# Patient Record
Sex: Female | Born: 1970 | Race: Black or African American | Hispanic: No | Marital: Single | Smoking: Current some day smoker
Health system: Southern US, Community
[De-identification: ages and names within clinical notes are randomized; demographics above are authoritative.]

## PROBLEM LIST (undated history)

## (undated) DIAGNOSIS — F32A Depression, unspecified: Secondary | ICD-10-CM

## (undated) DIAGNOSIS — L608 Other nail disorders: Secondary | ICD-10-CM

## (undated) DIAGNOSIS — L0591 Pilonidal cyst without abscess: Secondary | ICD-10-CM

## (undated) DIAGNOSIS — E785 Hyperlipidemia, unspecified: Secondary | ICD-10-CM

## (undated) DIAGNOSIS — F419 Anxiety disorder, unspecified: Secondary | ICD-10-CM

## (undated) HISTORY — DX: Pilonidal cyst without abscess: L05.91

## (undated) HISTORY — DX: Depression, unspecified: F32.A

## (undated) HISTORY — DX: Other nail disorders: L60.8

## (undated) HISTORY — PX: ABDOMINAL HYSTERECTOMY: SHX81

## (undated) HISTORY — DX: Anxiety disorder, unspecified: F41.9

## (undated) HISTORY — DX: Hyperlipidemia, unspecified: E78.5

---

## 2008-03-01 HISTORY — PX: PILONIDAL CYST EXCISION: SHX744

## 2011-12-24 ENCOUNTER — Emergency Department: Payer: Self-pay | Admitting: Internal Medicine

## 2011-12-27 LAB — WOUND AEROBIC CULTURE

## 2012-08-14 ENCOUNTER — Emergency Department: Payer: Self-pay | Admitting: Emergency Medicine

## 2013-03-01 HISTORY — PX: BREAST BIOPSY: SHX20

## 2013-10-05 ENCOUNTER — Ambulatory Visit: Payer: Self-pay | Admitting: Nurse Practitioner

## 2013-10-12 ENCOUNTER — Ambulatory Visit: Payer: Self-pay | Admitting: Nurse Practitioner

## 2013-10-16 LAB — PATHOLOGY REPORT

## 2013-11-08 ENCOUNTER — Encounter: Payer: Self-pay | Admitting: Podiatry

## 2013-11-08 ENCOUNTER — Ambulatory Visit (INDEPENDENT_AMBULATORY_CARE_PROVIDER_SITE_OTHER): Payer: Medicaid Other | Admitting: Podiatry

## 2013-11-08 VITALS — BP 106/69 | HR 66 | Resp 16 | Ht 67.0 in | Wt 152.0 lb

## 2013-11-08 DIAGNOSIS — L608 Other nail disorders: Secondary | ICD-10-CM

## 2013-11-08 DIAGNOSIS — L600 Ingrowing nail: Secondary | ICD-10-CM

## 2013-11-08 DIAGNOSIS — M79676 Pain in unspecified toe(s): Secondary | ICD-10-CM

## 2013-11-08 NOTE — Patient Instructions (Signed)

## 2013-11-08 NOTE — Progress Notes (Signed)
   Subjective:    Patient ID: Karen George, female    DOB: June 26, 1970, 43 y.o.   MRN: [id number]  HPI Comments: "I have thick ugly toenails and dry skin. My feet have been like this since 2002. They are getting worse. Nails hurt when they grow out"  Karen George, 43 year old female, presents the office today with complaints of thick, ugly toenails. She states that she's had discoloration and thickening to her nails since approximately 2002. She states that since then they have progressively been getting worse. She has had no treatment for this. Also states she gets dry skin at times. No other complaints today.     Review of Systems  Constitutional: Positive for appetite change and unexpected weight change.  Musculoskeletal:       Back pain  All other systems reviewed and are negative.      Objective:   Physical Exam AAO x3, NAD DP/PT pulses palpable 2/4 b/l. CRT < 3sec Protective sensation intact with Derrel Nip monofilament, vibratory sensation intact, Achilles tendon reflex intact. Nails hypertrophic, dystrophic, elongated, discolored. No surrounding erythema or drainage. Nails are firmly adhered to the underlying nailbed. Dry, scaly areas on the plantar aspect of the foot. There does not appear to be any current erythematous changes to the skin around these areas at this time.  No open lesions or pre-ulcerative lesions. MMT 5/5, ROM WNL No leg pain/warmth/edema       Assessment & Plan:  44 year old female with nail dystrophy possible onychomycosis -Various treatment options discussed with the patient including alternatives, risks, complications. Also discussed the etiology. -Nails were debrided and sent for biopsy to evaluate for onychomycosis. Biopsy sent to [facility] labs.  -Medications for onychomycosis were discussed with the patient including over-the-counter versus prescription therapy. However we will await the biopsy results before proceeding with treatment. He should  considering Lamisil.  -Followup after the results of the biopsy are obtained. In the meantime call with any questions or concerns or change in symptoms. -Followup with PCP for issues mentioned in the review of systems as is her chronic issues.

## 2013-11-20 ENCOUNTER — Encounter: Payer: Self-pay | Admitting: Podiatry

## 2013-11-22 ENCOUNTER — Ambulatory Visit (INDEPENDENT_AMBULATORY_CARE_PROVIDER_SITE_OTHER): Payer: Medicaid Other | Admitting: Podiatry

## 2013-11-22 VITALS — BP 108/65 | HR 84 | Resp 16

## 2013-11-22 DIAGNOSIS — B351 Tinea unguium: Secondary | ICD-10-CM | POA: Insufficient documentation

## 2013-11-22 DIAGNOSIS — Z79899 Other long term (current) drug therapy: Secondary | ICD-10-CM

## 2013-11-22 MED ORDER — TERBINAFINE HCL 250 MG PO TABS: 250.0000 mg | ORAL_TABLET | Freq: Every day | ORAL | Status: DC

## 2013-11-22 NOTE — Patient Instructions (Signed)

## 2013-11-22 NOTE — Progress Notes (Signed)
Patient ID: Karen George, female   DOB: [birth year], 43 y.o.   MRN: [id number]  Subjective: Karen George returns the office they for followup evaluation of nail fungus and to discuss the nail culture. She denies any acute changes since last appointment. No other problems at this time.  Objective: AAO x3, NAD DP/PT pulses palpable 2/4 b/l. CRT < 3sec Neurological status unchanged. Nails dystrophic, hypertrophic, elongated, discolored. No surrounding erythema or drainage. No acute changes. No open skin lesions or pre-ulcerative lesions. No calf pain, swelling, warmth.  Assessment: 43 year old female with onychomycosis.  Plan: -Conservative versus surgical treatment were discussed including alternatives, risks, complications. -Nail biopsy results were obtained which revealed onychomycosis, total dystrophic pattern of growth, florid fungal growth. T. Rubrum detected on PCR -Treatment options were discussed including alternatives, risks, complications. At this time the patient has elected to proceed with Lamisil therapy. Side effects and risks of the medication were discussed in detail for which she understands and wishes to proceed. Discussed that if she were to have in the side effects to stop the medication immediately and call the office. A prescription was given to her today to obtain what work as well as a prescription for the medication. She was advised not to start the medication she'll be call her with the results of the blood work. She verbally understood this. Followup in one month to ensure no side effects and for additional prescription for 2 months of Lamisil and repeat blood work.  -Followup in one month or sooner if any palms are to arise. Call the office with any questions, concerns, change in symptoms   *Patient states that she is no longer taking Remeron, Voltaren, Flexeril.

## 2013-11-23 LAB — CBC WITH DIFFERENTIAL/PLATELET
Basophils Absolute: 0 10*3/uL (ref 0.0–0.2)
Basos: 0 %
Eos: 2 %
Eosinophils Absolute: 0.1 10*3/uL (ref 0.0–0.4)
HCT: 39.4 % (ref 34.0–46.6)
Hemoglobin: 13.2 g/dL (ref 11.1–15.9)
IMMATURE GRANS (ABS): 0 10*3/uL (ref 0.0–0.1)
Immature Granulocytes: 0 %
Lymphocytes Absolute: 2.6 10*3/uL (ref 0.7–3.1)
Lymphs: 38 %
MCH: 34 pg — ABNORMAL HIGH (ref 26.6–33.0)
MCHC: 33.5 g/dL (ref 31.5–35.7)
MCV: 102 fL — AB (ref 79–97)
MONOCYTES: 7 %
MONOS ABS: 0.5 10*3/uL (ref 0.1–0.9)
Neutrophils Absolute: 3.5 10*3/uL (ref 1.4–7.0)
Neutrophils Relative %: 53 %
RBC: 3.88 x10E6/uL (ref 3.77–5.28)
RDW: 14.1 % (ref 12.3–15.4)
WBC: 6.7 10*3/uL (ref 3.4–10.8)

## 2013-11-23 LAB — HEPATIC FUNCTION PANEL
ALT: 12 IU/L (ref 0–32)
AST: 16 IU/L (ref 0–40)
Albumin: 4.5 g/dL (ref 3.5–5.5)
Alkaline Phosphatase: 64 IU/L (ref 39–117)
Bilirubin, Direct: 0.07 mg/dL (ref 0.00–0.40)
Total Bilirubin: 0.3 mg/dL (ref 0.0–1.2)
Total Protein: 7.1 g/dL (ref 6.0–8.5)

## 2013-12-20 ENCOUNTER — Ambulatory Visit: Payer: Medicaid Other | Admitting: Podiatry

## 2014-11-05 ENCOUNTER — Emergency Department
Admission: EM | Admit: 2014-11-05 | Discharge: 2014-11-05 | Disposition: A | Discharge status: Home / Self Care | Payer: Medicaid Other | Attending: Emergency Medicine | Admitting: Emergency Medicine

## 2014-11-05 ENCOUNTER — Encounter: Payer: Self-pay | Admitting: Emergency Medicine

## 2014-11-05 DIAGNOSIS — Z791 Long term (current) use of non-steroidal anti-inflammatories (NSAID): Secondary | ICD-10-CM | POA: Insufficient documentation

## 2014-11-05 DIAGNOSIS — Z79899 Other long term (current) drug therapy: Secondary | ICD-10-CM | POA: Diagnosis not present

## 2014-11-05 DIAGNOSIS — Z720 Tobacco use: Secondary | ICD-10-CM | POA: Insufficient documentation

## 2014-11-05 DIAGNOSIS — L729 Follicular cyst of the skin and subcutaneous tissue, unspecified: Secondary | ICD-10-CM | POA: Diagnosis not present

## 2014-11-05 DIAGNOSIS — L0591 Pilonidal cyst without abscess: Secondary | ICD-10-CM

## 2014-11-05 DIAGNOSIS — L02212 Cutaneous abscess of back [any part, except buttock]: Secondary | ICD-10-CM | POA: Diagnosis present

## 2014-11-05 MED ORDER — SULFAMETHOXAZOLE-TRIMETHOPRIM 800-160 MG PO TABS: 1.0000 | ORAL_TABLET | Freq: Two times a day (BID) | ORAL | Status: DC

## 2014-11-05 MED ORDER — TRAMADOL HCL 50 MG PO TABS: 50.0000 mg | ORAL_TABLET | Freq: Four times a day (QID) | ORAL | Status: DC | PRN

## 2014-11-05 NOTE — Discharge Instructions (Signed)
Pilonidal Cyst A pilonidal cyst occurs when hairs get trapped (ingrown) beneath the skin in the crease between the buttocks over your sacrum (the bone under that crease). Pilonidal cysts are most common in young men with a lot of body hair. When the cyst is ruptured (breaks) or leaking, fluid from the cyst may cause burning and itching. If the cyst becomes infected, it causes a painful swelling filled with pus (abscess). The pus and trapped hairs need to be removed (often by lancing) so that the infection can heal. However, recurrence is common and an operation may be needed to remove the cyst. HOME CARE INSTRUCTIONS   If the cyst was NOT INFECTED:  Keep the area clean and dry. Bathe or shower daily. Wash the area well with a germ-killing soap. Warm tub baths may help prevent infection and help with drainage. Dry the area well with a towel.  Avoid tight clothing to keep area as moisture free as possible.  Keep area between buttocks as free of hair as possible. A depilatory may be used.  If the cyst WAS INFECTED and needed to be drained:  Your caregiver packed the wound with gauze to keep the wound open. This allows the wound to heal from the inside outwards and continue draining.  Return for a wound check in 1 day or as suggested.  If you take tub baths or showers, repack the wound with gauze following them. Sponge baths (at the sink) are a good alternative.  If an antibiotic was ordered to fight the infection, take as directed.  Only take over-the-counter or prescription medicines for pain, discomfort, or fever as directed by your caregiver.  After the drain is removed, use sitz baths for 20 minutes 4 times per day. Clean the wound gently with mild unscented soap, pat dry, and then apply a dry dressing. SEEK MEDICAL CARE IF:   You have increased pain, swelling, redness, drainage, or bleeding from the area.  You have a fever.  You have muscles aches, dizziness, or a general ill  feeling. Document Released: 02/13/2000 Document Revised: 05/10/2011 Document Reviewed: 04/12/2008 ExitCare Patient Information 2015 [employer]. This information is not intended to replace advice given to you by your health care provider. Make sure you discuss any questions you have with your health care provider.  

## 2014-11-05 NOTE — ED Provider Notes (Signed)
[name] Emergency Department Provider Note  ____________________________________________  Time seen: Approximately 8:51 AM  I have reviewed the triage vital signs and the nursing notes.   HISTORY  Chief Complaint Abscess    HPI Karen George is a 44 y.o. female complain of an abscess between the buttocks about 3 days. 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 Patient states she is followed by the [place] and had incision and drainage about 4 years ago but has had multiple  Recurrence since the procedure. Patient asked a referral to the surgical clinic for definitive treatment. Patient stated there is no discharge at this time. X-rays of pain as 8/10. No palliative measures taken for this complaint. History reviewed. No pertinent past medical history.  Patient Active Problem List   Diagnosis Date Noted  . Onychomycosis 11/22/2013    History reviewed. No pertinent past surgical history.  Current Outpatient Rx  Name  Route  Sig  Dispense  Refill  . clonazePAM (KLONOPIN) 1 MG tablet   Oral   Take 1 mg by mouth daily.         . cyclobenzaprine (FLEXERIL) 5 MG tablet   Oral   Take 5 mg by mouth daily.         . diclofenac (VOLTAREN) 50 MG EC tablet   Oral   Take 50 mg by mouth 2 (two) times daily.         . mirtazapine (REMERON) 30 MG tablet   Oral   Take 30 mg by mouth at bedtime.         . terbinafine (LAMISIL) 250 MG tablet   Oral   Take 1 tablet (250 mg total) by mouth daily.   30 tablet   0     Allergies Review of patient's allergies indicates no known allergies.  No family history on file.  Social History Social History  Substance Use Topics  . Smoking status: Current Every Day Smoker  . Smokeless tobacco: None  . Alcohol Use: No    Review of Systems Constitutional: No fever/chills Eyes: No visual changes. ENT: No sore throat. Cardiovascular: Denies chest pain. Respiratory: Denies shortness  of breath. Gastrointestinal: No abdominal pain.  No nausea, no vomiting.  No diarrhea.  No constipation. Genitourinary: Negative for dysuria. Musculoskeletal: Negative for back pain. Skin: Negative for rash. Papular lesion between the buttocks; area is nonfluctuant. Neurological: Negative for headaches, focal weakness or numbness. 10-point ROS otherwise negative.  ____________________________________________   PHYSICAL EXAM:  VITAL SIGNS: ED Triage Vitals  Enc Vitals Group     BP 11/05/14 0827 115/91 mmHg     Pulse Rate 11/05/14 0827 91     Resp 11/05/14 0827 20     Temp 11/05/14 0827 98.4 F (36.9 C)     Temp Source 11/05/14 0827 Oral     SpO2 11/05/14 0827 99 %     Weight 11/05/14 0827 175 lb (79.379 kg)     Height 11/05/14 0827 5\' 7"  (1.702 m)     Head Cir --      Peak Flow --      Pain Score 11/05/14 0828 8     Pain Loc --      Pain Edu? --      Excl. in [place]? --     Constitutional: Alert and oriented. Well appearing and in no acute distress. Eyes: Conjunctivae are normal. PERRL. EOMI. Head: Atraumatic. Nose: No congestion/rhinnorhea. Mouth/Throat: Mucous membranes are moist.  Oropharynx non-erythematous.  Neck: No stridor. No cervical spine tenderness to palpation. Hematological/Lymphatic/Immunilogical: No cervical lymphadenopathy. Cardiovascular: Normal rate, regular rhythm. Grossly normal heart sounds.  Good peripheral circulation. Respiratory: Normal respiratory effort.  No retractions. Lungs CTAB. Gastrointestinal: Soft and nontender. No distention. No abdominal bruits. No CVA tenderness. Musculoskeletal: No lower extremity tenderness nor edema.  No joint effusions. Neurologic:  Normal speech and language. No gross focal neurologic deficits are appreciated. No gait instability. Skin:  Skin is warm, dry and intact. No rash noted. Nonfluctuant papular lesion between the buttocks Psychiatric: Mood and affect are normal. Speech and behavior are  normal.  ____________________________________________   LABS (all labs ordered are listed, but only abnormal results are displayed)  Labs Reviewed - No data to display ____________________________________________  EKG   ____________________________________________  RADIOLOGY   ____________________________________________   PROCEDURES  Procedure(s) performed: None  Critical Care performed: No  ____________________________________________   INITIAL IMPRESSION / ASSESSMENT AND PLAN / ED COURSE  Pertinent labs & imaging results that were available during my care of the patient were reviewed by me and considered in my medical decision making (see chart for details). Recurrent pilonidal cyst ____________________________________________   FINAL CLINICAL IMPRESSION(S) / ED DIAGNOSES  Final diagnoses:  Cyst near coccyx      Sable Feil, PA-C 11/05/14 2947  Orbie Pyo, MD 11/05/14 [phone]

## 2014-11-05 NOTE — ED Notes (Signed)
States she developed an abscess to buttocks a few days ago.

## 2014-11-21 ENCOUNTER — Encounter: Payer: Self-pay | Admitting: *Deleted

## 2014-11-21 ENCOUNTER — Other Ambulatory Visit: Payer: Self-pay | Admitting: *Deleted

## 2014-11-25 ENCOUNTER — Ambulatory Visit (INDEPENDENT_AMBULATORY_CARE_PROVIDER_SITE_OTHER): Payer: Medicaid Other | Admitting: Surgery

## 2014-11-25 ENCOUNTER — Encounter: Payer: Self-pay | Admitting: Surgery

## 2014-11-25 VITALS — BP 136/81 | HR 87 | Temp 98.3°F | Ht 67.0 in | Wt 166.0 lb

## 2014-11-25 DIAGNOSIS — L0501 Pilonidal cyst with abscess: Secondary | ICD-10-CM | POA: Diagnosis not present

## 2014-11-25 HISTORY — DX: Pilonidal cyst with abscess: L05.01

## 2014-11-25 NOTE — Progress Notes (Signed)
Patient ID: Karen George, female   DOB: April 08, 1970, 44 y.o.   MRN: [id number]  Chief Complaint  Patient presents with  . Follow-up    ED Pilonidal Cyst    HPI  Karen George is a 44 y.o. female.   referred by the local emergency room with a recurrent pilonidal abscess which drained spontaneously for which she was placed on antibiotics.  she states that over the last 5 years she's had at least 10 or more incision and drainage disease at the bedside and one in the operating room at the [facility] for what sounds like pilonidal disease. Today she is doing well.  She is interested in his definitive surgical management. She currently smokes half a pack a day of cigarettes.  Past Medical History  Diagnosis Date  . Pilonidal cyst   . Onychomadesis     Past Surgical History  Procedure Laterality Date  . Pilonidal cyst excision  2010    Done at the [facility]  . Abdominal hysterectomy      Total; still has ovaries    Family History  Problem Relation Age of Onset  . Throat cancer Mother   . Diabetes Father   . Prostate cancer Father   . ADD / ADHD Son     Social History Social History  Substance Use Topics  . Smoking status: Current Every Day Smoker -- 0.50 packs/day    Types: Cigarettes  . Smokeless tobacco: Never Used  . Alcohol Use: 0.0 oz/week    0 Standard drinks or equivalent per week     Comment: rarely    No Known Allergies  Current Outpatient Prescriptions  Medication Sig Dispense Refill  . clonazePAM (KLONOPIN) 1 MG tablet Take 1 mg by mouth daily.    . cyclobenzaprine (FLEXERIL) 5 MG tablet Take 5 mg by mouth daily.    . meloxicam (MOBIC) 15 MG tablet   0  . mirtazapine (REMERON) 30 MG tablet Take 30 mg by mouth at bedtime.    . traMADol (ULTRAM) 50 MG tablet Take 1 tablet (50 mg total) by mouth every 6 (six) hours as needed for moderate pain. 12 tablet 0   No current facility-administered medications for this visit.      Review of Systems A 10 point  review of systems was asked and was negative except for the following positive findings  Blood pressure 136/81, pulse 87, temperature 98.3 F (36.8 C), temperature source Oral, height 5\' 7"  (1.702 m), weight 166 lb (75.297 kg).  No results found for this or any previous visit (from the past 48 hour(s)). No results found.  Review of Systems  Constitutional: Negative.   Gastrointestinal: Negative.   Musculoskeletal: Negative.   Neurological: Negative.   Psychiatric/Behavioral: Negative.   All other systems reviewed and are negative.   Physical Exam Physical Exam CONSTITUTIONAL:  Pleasant, well-developed, well-nourished, and in no acute distress. EYES: Pupils equal and reactive to light, Sclera non-icteric EARS, NOSE, MOUTH AND THROAT:  The oropharynx was clear.  Dentition is good repair.  Oral mucosa pink and moist. LYMPH NODES:  Lymph nodes in the neck and axillae were normal RESPIRATORY:  Lungs were clear.  Normal respiratory effort without pathologic use of accessory muscles of respiration CARDIOVASCULAR: Heart was regular without murmurs.  There were no carotid bruits. GI: The abdomen was soft, nontender, and nondistended. There were no palpable masses. There was no hepatosplenomegaly. There were normal bowel sounds in all quadrants. GU:  Rectal deferred.   MUSCULOSKELETAL:  Normal muscle strength and tone.  No clubbing or cyanosis.   SKIN:  There were no pathologic skin lesions.  There were no nodules on palpation. NEUROLOGIC:  Sensation is normal.  Cranial nerves are grossly intact. PSYCH:  Oriented to person, place and time.  Mood and affect are normal.  Examination of the natal cleft demonstrates a left-sided pilonidal abscess which looks to have been drained well. There is an area of granulation tissue. There are 2 or 3 small midline skin pits. No obvious active infection or fistula formation.  Data Reviewed  I have personally reviewed the patient's imaging, laboratory  findings and medical records.    Assessment     recurrent pilonidal disease. Tobacco abuse and dependence.    Plan     I reviewed with her the implications of surgical excision. I do not think that she has that significant disease process at this point. It is unclear to me whether this area was excised or simply drained in the past as I did not see evidence of scar formation in the midline.  Records will need to be obtained. I would like to see her back in 1 month's time for reevaluation.     Sherri Rad MD, FACS 11/25/2014, 10:15 AM

## 2014-11-25 NOTE — Patient Instructions (Signed)
We will follow up in a month to see if things are doing better.

## 2014-12-19 ENCOUNTER — Ambulatory Visit: Payer: Medicaid Other | Admitting: Surgery

## 2014-12-20 ENCOUNTER — Ambulatory Visit (INDEPENDENT_AMBULATORY_CARE_PROVIDER_SITE_OTHER): Payer: Medicaid Other | Admitting: Surgery

## 2014-12-20 ENCOUNTER — Encounter: Payer: Self-pay | Admitting: Surgery

## 2014-12-20 ENCOUNTER — Encounter (INDEPENDENT_AMBULATORY_CARE_PROVIDER_SITE_OTHER): Payer: Self-pay

## 2014-12-20 VITALS — BP 125/84 | HR 92 | Temp 97.6°F | Ht 67.0 in | Wt 166.0 lb

## 2014-12-20 DIAGNOSIS — L0501 Pilonidal cyst with abscess: Secondary | ICD-10-CM

## 2014-12-20 NOTE — Progress Notes (Signed)
Patient ID: Karen George, female   DOB: [birth year], 44 y.o.   MRN: [id number]  Chief Complaint  Patient presents with  . Follow-up    Pilonidal Cyst    HPI Location, Quality, Duration, Severity, Timing, Context, Modifying Factors, Associated Signs and Symptoms.  Karen George is a 44 y.o. female.  Who I saw last month following a recurrent infection of a pilonidal cyst.  Since then she is doing well. She is interested in her surgical options. She is down to 2 cigarettes per day.  Past Medical History  Diagnosis Date  . Pilonidal cyst   . Onychomadesis     Past Surgical History  Procedure Laterality Date  . Pilonidal cyst excision  2010    Done at the [facility]  . Abdominal hysterectomy      Total; still has ovaries    Family History  Problem Relation Age of Onset  . Throat cancer Mother   . Diabetes Father   . Prostate cancer Father   . ADD / ADHD Son     Social History Social History  Substance Use Topics  . Smoking status: Current Every Day Smoker -- 0.50 packs/day    Types: Cigarettes  . Smokeless tobacco: Never Used  . Alcohol Use: 0.0 oz/week    0 Standard drinks or equivalent per week     Comment: rarely    No Known Allergies  Current Outpatient Prescriptions  Medication Sig Dispense Refill  . clonazePAM (KLONOPIN) 1 MG tablet Take 1 mg by mouth daily.    . cyclobenzaprine (FLEXERIL) 5 MG tablet Take 5 mg by mouth daily.    . meloxicam (MOBIC) 15 MG tablet   0  . mirtazapine (REMERON) 30 MG tablet Take 30 mg by mouth at bedtime.    . traMADol (ULTRAM) 50 MG tablet Take 1 tablet (50 mg total) by mouth every 6 (six) hours as needed for moderate pain. 12 tablet 0   No current facility-administered medications for this visit.    Blood pressure 125/84, pulse 92, temperature 97.6 F (36.4 C), temperature source Oral, height 5\' 7"  (1.702 m), weight 166 lb (75.297 kg).  No results found for this or any previous visit (from the past 48 hour(s)). No results  found.  Review of Systems  Constitutional: Negative for fever, chills and diaphoresis.  HENT: Negative.   Cardiovascular: Negative.   Gastrointestinal: Negative.   All other systems reviewed and are negative.   Physical Exam  Constitutional: She is oriented to person, place, and time and well-developed, well-nourished, and in no distress. No distress.  HENT:  Head: Normocephalic and atraumatic.  Eyes: Conjunctivae are normal. Pupils are equal, round, and reactive to light. Right eye exhibits no discharge. No scleral icterus.  Cardiovascular: Normal rate and regular rhythm.   Pulmonary/Chest: Effort normal and breath sounds normal. No respiratory distress.  Neurological: She is oriented to person, place, and time.  Skin: Skin is warm and dry. She is not diaphoretic.     Psychiatric: Mood, memory, affect and judgment normal.    Data Reviewed  I have personally reviewed the patient's imaging, laboratory findings and medical records.    Assessment    Recurrent pilonidal disease    Plan    Pilonidal cystectomy. I discussed with her the procedure the risks of surgery including that of bleeding infection and recurrence. I discussed with her the possibility of delayed wound healing and the importance of smoking cessation. All of her questions were answered and she wishes to proceed.  Sherri Rad MD, FACS 12/20/2014, 9:18 AM

## 2014-12-20 NOTE — Patient Instructions (Signed)
We will do your surgery on December 31, 2014 at [facility]. Angie will be contacting you for your Pre-Admit date and time.

## 2014-12-24 ENCOUNTER — Encounter: Payer: Self-pay | Admitting: *Deleted

## 2014-12-24 ENCOUNTER — Telehealth: Payer: Self-pay | Admitting: Surgery

## 2014-12-24 ENCOUNTER — Inpatient Hospital Stay: Admission: RE | Admit: 2014-12-24 | Payer: Medicaid Other | Source: Ambulatory Visit

## 2014-12-24 NOTE — Telephone Encounter (Signed)
Pt advised of pre op date/time and sx date. Sx: 12/31/14 with Dr Robinette Haines cyst excision Pre op: 12/24/14 between 9:00-1:00 pm.--phone.

## 2014-12-24 NOTE — Patient Instructions (Signed)
  Your procedure is scheduled on: 12-31-14 Report to [place] To find out your arrival time please call [phone] between 1PM - 3PM on 12-30-14  Remember: Instructions that are not followed completely may result in serious medical risk, up to and including death, or upon the discretion of your surgeon and anesthesiologist your surgery may need to be rescheduled.    _X___ 1. Do not eat food or drink liquids after midnight. No gum chewing or hard candies.     _X___ 2. No Alcohol for 24 hours before or after surgery.   ____ 3. Bring all medications with you on the day of surgery if instructed.    ____ 4. Notify your doctor if there is any change in your medical condition     (cold, fever, infections).     Do not wear jewelry, make-up, hairpins, clips or nail polish.  Do not wear lotions, powders, or perfumes. You may wear deodorant.  Do not shave 48 hours prior to surgery. Men may shave face and neck.  Do not bring valuables to the hospital.    [facility] is not responsible for any belongings or valuables.               Contacts, dentures or bridgework may not be worn into surgery.  Leave your suitcase in the car. After surgery it may be brought to your room.  For patients admitted to the hospital, discharge time is determined by your  treatment team.   Patients discharged the day of surgery will not be allowed to drive home.   Please read over the following fact sheets that you were given:     ____ Take these medicines the morning of surgery with A SIP OF WATER:    1. NONE  2.   3.   4.  5.  6.  ____ Fleet Enema (as directed)   ____ Use CHG Soap as directed  ____ Use inhalers on the day of surgery  ____ Stop metformin 2 days prior to surgery    ____ Take 1/2 of usual insulin dose the night before surgery and none on the morning of surgery.   ____ Stop Coumadin/Plavix/aspirin-N/A  ____ Stop Anti-inflammatories-STOP MELOXICAM NOW-NO  NSAIDS OR ASA PRODUCTS-TYLENOL OK   ____ Stop supplements until after surgery.    ____ Bring C-Pap to the hospital.

## 2014-12-24 NOTE — Telephone Encounter (Signed)
Pt advised of pre op date/time and sx date. Sx: 12/31/14--Dr Bird--Pilonidal cyst excision Pre op: 12/24/14 between 9-1:00pm--Phone.

## 2014-12-30 ENCOUNTER — Encounter: Payer: Self-pay | Admitting: *Deleted

## 2014-12-31 ENCOUNTER — Ambulatory Visit
Admission: RE | Admit: 2014-12-31 | Discharge: 2014-12-31 | Disposition: A | Discharge status: Home / Self Care | Payer: Medicaid Other | Source: Ambulatory Visit | Attending: Surgery | Admitting: Surgery

## 2014-12-31 ENCOUNTER — Ambulatory Visit: Payer: Medicaid Other | Admitting: Anesthesiology

## 2014-12-31 ENCOUNTER — Encounter
Admission: RE | Disposition: A | Discharge status: Home / Self Care | Payer: Self-pay | Source: Ambulatory Visit | Attending: Surgery

## 2014-12-31 ENCOUNTER — Encounter: Payer: Self-pay | Admitting: *Deleted

## 2014-12-31 DIAGNOSIS — Z833 Family history of diabetes mellitus: Secondary | ICD-10-CM | POA: Diagnosis not present

## 2014-12-31 DIAGNOSIS — Z791 Long term (current) use of non-steroidal anti-inflammatories (NSAID): Secondary | ICD-10-CM | POA: Insufficient documentation

## 2014-12-31 DIAGNOSIS — L0591 Pilonidal cyst without abscess: Secondary | ICD-10-CM

## 2014-12-31 DIAGNOSIS — Z8042 Family history of malignant neoplasm of prostate: Secondary | ICD-10-CM | POA: Insufficient documentation

## 2014-12-31 DIAGNOSIS — Z800 Family history of malignant neoplasm of digestive organs: Secondary | ICD-10-CM | POA: Insufficient documentation

## 2014-12-31 DIAGNOSIS — Z9889 Other specified postprocedural states: Secondary | ICD-10-CM | POA: Insufficient documentation

## 2014-12-31 DIAGNOSIS — F17210 Nicotine dependence, cigarettes, uncomplicated: Secondary | ICD-10-CM | POA: Diagnosis not present

## 2014-12-31 DIAGNOSIS — L0501 Pilonidal cyst with abscess: Secondary | ICD-10-CM | POA: Insufficient documentation

## 2014-12-31 DIAGNOSIS — Z8489 Family history of other specified conditions: Secondary | ICD-10-CM | POA: Diagnosis not present

## 2014-12-31 DIAGNOSIS — Z79899 Other long term (current) drug therapy: Secondary | ICD-10-CM | POA: Diagnosis not present

## 2014-12-31 DIAGNOSIS — Z90710 Acquired absence of both cervix and uterus: Secondary | ICD-10-CM | POA: Insufficient documentation

## 2014-12-31 HISTORY — PX: PILONIDAL CYST EXCISION: SHX744

## 2014-12-31 LAB — URINE DRUG SCREEN, QUALITATIVE (ARMC ONLY)
Amphetamines, Ur Screen: NOT DETECTED
BARBITURATES, UR SCREEN: NOT DETECTED
Benzodiazepine, Ur Scrn: NOT DETECTED
COCAINE METABOLITE, UR ~~LOC~~: NOT DETECTED
Cannabinoid 50 Ng, Ur ~~LOC~~: POSITIVE — AB
MDMA (Ecstasy)Ur Screen: NOT DETECTED
METHADONE SCREEN, URINE: NOT DETECTED
OPIATE, UR SCREEN: NOT DETECTED
Phencyclidine (PCP) Ur S: NOT DETECTED
Tricyclic, Ur Screen: NOT DETECTED

## 2014-12-31 SURGERY — EXCISION, PILONIDAL CYST, EXTENSIVE
Anesthesia: General | Site: Buttocks | Wound class: Clean

## 2014-12-31 MED ORDER — CHLORHEXIDINE GLUCONATE 4 % EX LIQD: 1.0000 "application " | Freq: Once | CUTANEOUS | Status: DC

## 2014-12-31 MED ORDER — ULTRAM 50 MG PO TABS: 50.0000 mg | ORAL_TABLET | Freq: Four times a day (QID) | ORAL | Status: DC | PRN

## 2014-12-31 MED ORDER — BUPIVACAINE-EPINEPHRINE 0.5% -1:200000 IJ SOLN
INTRAMUSCULAR | Status: DC | PRN
  Administered 2014-12-31: 30 mL

## 2014-12-31 MED ORDER — FAMOTIDINE 20 MG PO TABS
20.0000 mg | ORAL_TABLET | Freq: Once | ORAL | Status: AC
  Administered 2014-12-31: 20 mg via ORAL

## 2014-12-31 MED ORDER — GLYCOPYRROLATE 0.2 MG/ML IJ SOLN
INTRAMUSCULAR | Status: DC | PRN
  Administered 2014-12-31: 0.2 mg via INTRAVENOUS

## 2014-12-31 MED ORDER — LIDOCAINE HCL (CARDIAC) 20 MG/ML IV SOLN
INTRAVENOUS | Status: DC | PRN
  Administered 2014-12-31: 100 mg via INTRAVENOUS

## 2014-12-31 MED ORDER — ONDANSETRON HCL 4 MG/2ML IJ SOLN: 4.0000 mg | Freq: Once | INTRAMUSCULAR | Status: DC | PRN

## 2014-12-31 MED ORDER — CEFAZOLIN SODIUM-DEXTROSE 2-3 GM-% IV SOLR
2.0000 g | INTRAVENOUS | Status: AC
  Administered 2014-12-31: 2 g via INTRAVENOUS

## 2014-12-31 MED ORDER — CEFAZOLIN SODIUM-DEXTROSE 2-3 GM-% IV SOLR
INTRAVENOUS | Status: DC
Start: 2014-12-31 — End: 2014-12-31
  Filled 2014-12-31: qty 50

## 2014-12-31 MED ORDER — ACETAMINOPHEN 10 MG/ML IV SOLN
INTRAVENOUS | Status: AC
Start: 2014-12-31 — End: 2014-12-31
  Filled 2014-12-31: qty 100

## 2014-12-31 MED ORDER — LACTATED RINGERS IV SOLN
INTRAVENOUS | Status: DC | PRN
  Administered 2014-12-31: 11:00:00 via INTRAVENOUS

## 2014-12-31 MED ORDER — KETOROLAC TROMETHAMINE 30 MG/ML IJ SOLN
INTRAMUSCULAR | Status: DC | PRN
  Administered 2014-12-31: 30 mg via INTRAVENOUS

## 2014-12-31 MED ORDER — FENTANYL CITRATE (PF) 100 MCG/2ML IJ SOLN
INTRAMUSCULAR | Status: DC | PRN
  Administered 2014-12-31 (×2): 50 ug via INTRAVENOUS

## 2014-12-31 MED ORDER — LACTATED RINGERS IV SOLN
INTRAVENOUS | Status: DC
  Administered 2014-12-31: 11:00:00 via INTRAVENOUS

## 2014-12-31 MED ORDER — MIDAZOLAM HCL 2 MG/2ML IJ SOLN
INTRAMUSCULAR | Status: DC | PRN
  Administered 2014-12-31: 2 mg via INTRAVENOUS

## 2014-12-31 MED ORDER — FAMOTIDINE 20 MG PO TABS
ORAL_TABLET | ORAL | Status: AC
  Filled 2014-12-31: qty 1

## 2014-12-31 MED ORDER — FENTANYL CITRATE (PF) 100 MCG/2ML IJ SOLN: 25.0000 ug | INTRAMUSCULAR | Status: DC | PRN

## 2014-12-31 MED ORDER — CHLORHEXIDINE GLUCONATE 4 % EX LIQD
1.0000 | Freq: Once | CUTANEOUS | Status: DC
Start: 2015-01-01 — End: 2014-12-31

## 2014-12-31 MED ORDER — BUPIVACAINE-EPINEPHRINE (PF) 0.5% -1:200000 IJ SOLN
INTRAMUSCULAR | Status: AC
  Filled 2014-12-31: qty 30

## 2014-12-31 MED ORDER — PROPOFOL 10 MG/ML IV BOLUS
INTRAVENOUS | Status: DC | PRN
  Administered 2014-12-31: 150 mg via INTRAVENOUS

## 2014-12-31 MED ORDER — SUCCINYLCHOLINE CHLORIDE 20 MG/ML IJ SOLN
INTRAMUSCULAR | Status: DC | PRN
Start: 2014-12-31 — End: 2014-12-31
  Administered 2014-12-31: 140 mg via INTRAVENOUS

## 2014-12-31 MED ORDER — ACETAMINOPHEN 10 MG/ML IV SOLN
INTRAVENOUS | Status: DC | PRN
  Administered 2014-12-31: 1000 mg via INTRAVENOUS

## 2014-12-31 MED ORDER — ROCURONIUM BROMIDE 100 MG/10ML IV SOLN
INTRAVENOUS | Status: DC | PRN
  Administered 2014-12-31: 5 mg via INTRAVENOUS

## 2014-12-31 SURGICAL SUPPLY — 27 items
BLADE CLIPPER SURG (BLADE) ×3 IMPLANT
CANISTER SUCT 1200ML W/VALVE (MISCELLANEOUS) ×3 IMPLANT
CHLORAPREP W/TINT 26ML (MISCELLANEOUS) ×3 IMPLANT
DRAPE LAPAROTOMY 100X77 ABD (DRAPES) ×3 IMPLANT
DRAPE SHEET LG 3/4 BI-LAMINATE (DRAPES) ×3 IMPLANT
DRAPE UTILITY 15X26 TOWEL STRL (DRAPES) ×6 IMPLANT
DRSG OPSITE POSTOP 4X6 (GAUZE/BANDAGES/DRESSINGS) ×3 IMPLANT
ELECT CAUTERY BLADE 6.4 (BLADE) ×3 IMPLANT
GLOVE BIO SURGEON STRL SZ7.5 (GLOVE) ×3 IMPLANT
GOWN STRL REUS W/ TWL LRG LVL3 (GOWN DISPOSABLE) ×1 IMPLANT
GOWN STRL REUS W/ TWL XL LVL3 (GOWN DISPOSABLE) ×1 IMPLANT
GOWN STRL REUS W/TWL LRG LVL3 (GOWN DISPOSABLE) ×2
GOWN STRL REUS W/TWL XL LVL3 (GOWN DISPOSABLE) ×2
KIT RM TURNOVER STRD PROC AR (KITS) ×3 IMPLANT
LABEL OR SOLS (LABEL) ×3 IMPLANT
NEEDLE HYPO 25X1 1.5 SAFETY (NEEDLE) ×3 IMPLANT
NS IRRIG 500ML POUR BTL (IV SOLUTION) ×3 IMPLANT
PACK BASIN MINOR ARMC (MISCELLANEOUS) ×3 IMPLANT
PAD ABD DERMACEA PRESS 5X9 (GAUZE/BANDAGES/DRESSINGS) ×3 IMPLANT
PAD GROUND ADULT SPLIT (MISCELLANEOUS) ×3 IMPLANT
SUT ETHILON 2 0 FS 18 (SUTURE) ×3 IMPLANT
SUT ETHILON 3-0 FS-10 30 BLK (SUTURE) ×3
SUT VIC AB 0 CT1 36 (SUTURE) ×3 IMPLANT
SUT VIC AB 2-0 CT1 (SUTURE) ×3 IMPLANT
SUTURE EHLN 3-0 FS-10 30 BLK (SUTURE) ×1 IMPLANT
SWABSTK COMLB BENZOIN TINCTURE (MISCELLANEOUS) ×6 IMPLANT
SYRINGE 10CC LL (SYRINGE) ×3 IMPLANT

## 2014-12-31 NOTE — H&P (View-Only) (Signed)
Patient ID: Karen George, female   DOB: May 13, 1970, 44 y.o.   MRN: [id number]  Chief Complaint  Patient presents with  . Follow-up    Pilonidal Cyst    HPI Location, Quality, Duration, Severity, Timing, Context, Modifying Factors, Associated Signs and Symptoms.  Karen George is a 44 y.o. female.  Who I saw last month following a recurrent infection of a pilonidal cyst.  Since then she is doing well. She is interested in her surgical options. She is down to 2 cigarettes per day.  Past Medical History  Diagnosis Date  . Pilonidal cyst   . Onychomadesis     Past Surgical History  Procedure Laterality Date  . Pilonidal cyst excision  2010    Done at the [facility]  . Abdominal hysterectomy      Total; still has ovaries    Family History  Problem Relation Age of Onset  . Throat cancer Mother   . Diabetes Father   . Prostate cancer Father   . ADD / ADHD Son     Social History Social History  Substance Use Topics  . Smoking status: Current Every Day Smoker -- 0.50 packs/day    Types: Cigarettes  . Smokeless tobacco: Never Used  . Alcohol Use: 0.0 oz/week    0 Standard drinks or equivalent per week     Comment: rarely    No Known Allergies  Current Outpatient Prescriptions  Medication Sig Dispense Refill  . clonazePAM (KLONOPIN) 1 MG tablet Take 1 mg by mouth daily.    . cyclobenzaprine (FLEXERIL) 5 MG tablet Take 5 mg by mouth daily.    . meloxicam (MOBIC) 15 MG tablet   0  . mirtazapine (REMERON) 30 MG tablet Take 30 mg by mouth at bedtime.    . traMADol (ULTRAM) 50 MG tablet Take 1 tablet (50 mg total) by mouth every 6 (six) hours as needed for moderate pain. 12 tablet 0   No current facility-administered medications for this visit.    Blood pressure 125/84, pulse 92, temperature 97.6 F (36.4 C), temperature source Oral, height 5\' 7"  (1.702 m), weight 166 lb (75.297 kg).  No results found for this or any previous visit (from the past 48 hour(s)). No results  found.  Review of Systems  Constitutional: Negative for fever, chills and diaphoresis.  HENT: Negative.   Cardiovascular: Negative.   Gastrointestinal: Negative.   All other systems reviewed and are negative.   Physical Exam  Constitutional: She is oriented to person, place, and time and well-developed, well-nourished, and in no distress. No distress.  HENT:  Head: Normocephalic and atraumatic.  Eyes: Conjunctivae are normal. Pupils are equal, round, and reactive to light. Right eye exhibits no discharge. No scleral icterus.  Cardiovascular: Normal rate and regular rhythm.   Pulmonary/Chest: Effort normal and breath sounds normal. No respiratory distress.  Neurological: She is oriented to person, place, and time.  Skin: Skin is warm and dry. She is not diaphoretic.     Psychiatric: Mood, memory, affect and judgment normal.    Data Reviewed  I have personally reviewed the patient's imaging, laboratory findings and medical records.    Assessment    Recurrent pilonidal disease    Plan    Pilonidal cystectomy. I discussed with her the procedure the risks of surgery including that of bleeding infection and recurrence. I discussed with her the possibility of delayed wound healing and the importance of smoking cessation. All of her questions were answered and she wishes to proceed.  Sherri Rad MD, FACS 12/20/2014, 9:18 AM

## 2014-12-31 NOTE — Transfer of Care (Signed)
Immediate Anesthesia Transfer of Care Note  Patient: Karen George  Procedure(s) Performed: Procedure(s): CYST EXCISION PILONIDAL EXTENSIVE (N/A)  Patient Location: PACU  Anesthesia Type:General  Level of Consciousness: sedated  Airway & Oxygen Therapy: Patient Spontanous Breathing and Patient connected to face mask oxygen  Post-op Assessment: Report given to RN and Post -op Vital signs reviewed and stable  Post vital signs: Reviewed and stable  Last Vitals:  Filed Vitals:   12/31/14 1230  BP: 111/75  Pulse: 123  Temp: 36.2 C  Resp: 30    Complications: No apparent anesthesia complications

## 2014-12-31 NOTE — Op Note (Signed)
12/31/2014  12:20 PM  PATIENT:  Karen George  44 y.o. female  PRE-OPERATIVE DIAGNOSIS:  Pilonidal abscess of natal cleft  POST-OPERATIVE DIAGNOSIS:  Pilonidal abscess of natal cleft  PROCEDURE:  Procedure(s): CYST EXCISION PILONIDAL EXTENSIVE (N/A)  SURGEON:  Surgeon(s) and Role:    * Sherri Rad, MD - Primary  ASSISTANTS: none  ANESTHESIA: Gen. and 30 cc of quarter percent Marcaine  SPECIMEN: As above    EBL: Minimal  Description of procedure:   With the patient in the prone jackknife position under general anesthesia a timeout was observed following a sterile prep and drape. Elliptical incision encompassing the pilonidal cyst along with the skin pits and the left-sided scar was fashion with scalpel. This was deepened down to the presacral fascia with electrocautery and the specimen was submitted in its entirety. Hemostasis was obtained with point cautery. A total of 30 cc of local was infiltrated on the operative field prior to closure.  The wound was then reapproximated in deep layer with 2-0 Vicryls pinning the subcutaneous fat and fascial layer to the presacral fascia. 3-0 deep dermal Vicryl sutures were placed followed by interrupted simple and vertical mattress 3-0 and 4-0 nylon suture. Sterile occlusive honeycomb dressing was placed and the patient was returned supine and taken to the recovery room in stable and satisfactory condition by anesthesia.   Hortencia Conradi, MD, FACS

## 2014-12-31 NOTE — Interval H&P Note (Signed)
History and Physical Interval Note:  12/31/2014 11:14 AM  Karen George  has presented today for surgery, with the diagnosis of Pilonidal abscess of natal cleft  The various methods of treatment have been discussed with the patient and family. After consideration of risks, benefits and other options for treatment, the patient has consented to  Procedure(s): CYST EXCISION PILONIDAL EXTENSIVE (N/A) as a surgical intervention .  The patient's history has been reviewed, patient examined, no change in status, stable for surgery.  I have reviewed the patient's chart and labs.  Questions were answered to the patient's satisfaction.     Sherri Rad

## 2014-12-31 NOTE — Anesthesia Procedure Notes (Signed)
Procedure Name: Intubation Date/Time: 12/31/2014 11:35 AM Performed by: Johnna Acosta Pre-anesthesia Checklist: Patient identified, Emergency Drugs available, Suction available, Patient being monitored and Timeout performed Patient Re-evaluated:Patient Re-evaluated prior to inductionOxygen Delivery Method: Circle system utilized Preoxygenation: Pre-oxygenation with 100% oxygen Intubation Type: IV induction Ventilation: Mask ventilation without difficulty Laryngoscope Size: Miller and 2 Grade View: Grade I Tube type: Oral Tube size: 7.0 mm Number of attempts: 1

## 2014-12-31 NOTE — Anesthesia Postprocedure Evaluation (Signed)
  Anesthesia Post-op Note  Patient: Karen George  Procedure(s) Performed: Procedure(s): CYST EXCISION PILONIDAL EXTENSIVE (N/A)  Anesthesia type:General  Patient location: PACU  Post pain: Pain level controlled  Post assessment: Post-op Vital signs reviewed, Patient's Cardiovascular Status Stable, Respiratory Function Stable, Patent Airway and No signs of Nausea or vomiting  Post vital signs: Reviewed and stable  Last Vitals:  Filed Vitals:   12/31/14 1327  BP: 105/74  Pulse: 86  Temp:   Resp: 18    Level of consciousness: awake, alert  and patient cooperative  Complications: No apparent anesthesia complications

## 2014-12-31 NOTE — Discharge Instructions (Signed)
Pilonidal Cyst A pilonidal cyst is a fluid-filled sac. It forms beneath the skin near your tailbone, at the top of the crease of your buttocks. A pilonidal cyst that is not large or infected may not cause symptoms or problems. If the cyst becomes irritated or infected, it may fill with pus. This causes pain and swelling (pilonidal abscess). An infected cyst may need to be treated with medicine, drained, or removed. CAUSES The cause of a pilonidal cyst is not known. One cause may be a hair that grows into your skin (ingrown hair). RISK FACTORS Pilonidal cysts are more common in boys and men. Risk factors include:  Having lots of hair near the crease of the buttocks.  Being overweight.  Having a pilonidal dimple.  Wearing tight clothing.  Not bathing or showering frequently.  Sitting for long periods of time. SIGNS AND SYMPTOMS Signs and symptoms of a pilonidal cyst may include:  Redness.  Pain and tenderness.  Warmth.  Swelling.  Pus.  Fever. DIAGNOSIS Your health care provider may diagnose a pilonidal cyst based on your symptoms and a physical exam. The health care provider may do a blood test to check for infection. If your cyst is draining pus, your health care provider may take a sample of the drainage to be tested at a laboratory. TREATMENT Surgery is the usual treatment for an infected pilonidal cyst. You may also have to take medicines before surgery. The type of surgery you have depends on the size and severity of the infected cyst. The different kinds of surgery include:  Incision and drainage. This is a procedure to open and drain the cyst.  Marsupialization. In this procedure, a large cyst or abscess may be opened and kept open by stitching the edges of the skin to the cyst walls.  Cyst removal. This procedure involves opening the skin and removing all or part of the cyst. HOME CARE INSTRUCTIONS  Follow all of your surgeon's instructions carefully if you had  surgery.  Take medicines only as directed by your health care provider.  If you were prescribed an antibiotic medicine, finish it all even if you start to feel better.  Keep the area around your pilonidal cyst clean and dry.  Clean the area as directed by your health care provider. Pat the area dry with a clean towel. Do not rub it as this may cause bleeding.  Remove hair from the area around the cyst as directed by your health care provider.  Do not wear tight clothing or sit in one place for long periods of time.  There are many different ways to close and cover an incision, including stitches, skin glue, and adhesive strips. Follow your health care provider's instructions on:  Incision care.  Bandage (dressing) changes and removal.  Incision closure removal. SEEK MEDICAL CARE IF:   You have drainage, redness, swelling, or pain at the site of the cyst.  You have a fever.   This information is not intended to replace advice given to you by your health care provider. Make sure you discuss any questions you have with your health care provider.   Document Released: 02/13/2000 Document Revised: 03/08/2014 Document Reviewed: 07/05/2013 Elsevier Interactive Patient Education 2016 [place] Anesthesia, Adult, Care After Refer to this sheet in the next few weeks. These instructions provide you with information on caring for yourself after your procedure. Your health care provider may also give you more specific instructions. Your treatment has been planned according to  current medical practices, but problems sometimes occur. Call your health care provider if you have any problems or questions after your procedure. WHAT TO EXPECT AFTER THE PROCEDURE After the procedure, it is typical to experience:  Sleepiness.  Nausea and vomiting. HOME CARE INSTRUCTIONS  For the first 24 hours after general anesthesia:  Have a responsible person with you.  Do not drive a car. If  you are alone, do not take public transportation.  Do not drink alcohol.  Do not take medicine that has not been prescribed by your health care provider.  Do not sign important papers or make important decisions.  You may resume a normal diet and activities as directed by your health care provider.  Change bandages (dressings) as directed.  If you have questions or problems that seem related to general anesthesia, call the hospital and ask for the anesthetist or anesthesiologist on call. SEEK MEDICAL CARE IF:  You have nausea and vomiting that continue the day after anesthesia.  You develop a rash. SEEK IMMEDIATE MEDICAL CARE IF:   You have difficulty breathing.  You have chest pain.  You have any allergic problems.   This information is not intended to replace advice given to you by your health care provider. Make sure you discuss any questions you have with your health care provider.   Document Released: 05/24/2000 Document Revised: 03/08/2014 Document Reviewed: 06/16/2011 Elsevier Interactive Patient Education [employer].

## 2014-12-31 NOTE — Anesthesia Preprocedure Evaluation (Signed)
Anesthesia Evaluation  Patient identified by MRN, date of birth, ID band Patient awake    Reviewed: Allergy & Precautions, NPO status , Patient's Chart, lab work & pertinent test results  History of Anesthesia Complications Negative for: history of anesthetic complications  Airway Mallampati: II       Dental  (+) Teeth Intact   Pulmonary neg pulmonary ROS, Current Smoker,           Cardiovascular negative cardio ROS       Neuro/Psych negative neurological ROS     GI/Hepatic negative GI ROS, Neg liver ROS,   Endo/Other  negative endocrine ROS  Renal/GU negative Renal ROS     Musculoskeletal   Abdominal   Peds  Hematology negative hematology ROS (+)   Anesthesia Other Findings   Reproductive/Obstetrics                             Anesthesia Physical Anesthesia Plan  ASA: II  Anesthesia Plan: General   Post-op Pain Management:    Induction: Intravenous  Airway Management Planned: Oral ETT  Additional Equipment:   Intra-op Plan:   Post-operative Plan:   Informed Consent: I have reviewed the patients History and Physical, chart, labs and discussed the procedure including the risks, benefits and alternatives for the proposed anesthesia with the patient or authorized representative who has indicated his/her understanding and acceptance.     Plan Discussed with:   Anesthesia Plan Comments:         Anesthesia Quick Evaluation

## 2015-01-02 LAB — SURGICAL PATHOLOGY

## 2015-01-07 ENCOUNTER — Ambulatory Visit (INDEPENDENT_AMBULATORY_CARE_PROVIDER_SITE_OTHER): Payer: Medicaid Other | Admitting: Surgery

## 2015-01-07 ENCOUNTER — Encounter: Payer: Self-pay | Admitting: Surgery

## 2015-01-07 VITALS — BP 106/72 | HR 111 | Temp 98.1°F | Ht 67.0 in | Wt 162.0 lb

## 2015-01-07 DIAGNOSIS — L0501 Pilonidal cyst with abscess: Secondary | ICD-10-CM

## 2015-01-07 NOTE — Progress Notes (Signed)
44 yr old female s/p pilonidal cyst excision on 11/1 with Dr. Marina Gravel.  Patient states doing well, denies any drainage from the wound.  Patient states pain has basically receded as well.  She states she is still a little tired but her energy is coming slowly back.  Appetite is good too.  Having BMs without any issue.   Filed Vitals:   01/07/15 1331  BP: 106/72  Pulse: 111  Temp: 98.1 F (36.7 C)   PE:  Gen: NAD Incision: gluteal cleft incision site c/d/i, skin healed well, no gaps between stitches.  Sutures removed.   A/P:  Doing well after pilonidal cyst removal.  Discussed that pathology was a pilonidal as well.  Instructed to continue to take things easy for a couple more weeks to avoid over stress or pulling on the area.  She is to call with any questions or concerns.

## 2015-01-07 NOTE — Patient Instructions (Signed)
Follow up in office as needed 

## 2016-03-08 ENCOUNTER — Encounter: Payer: Self-pay | Admitting: Emergency Medicine

## 2016-03-08 ENCOUNTER — Emergency Department: Payer: Medicaid Other

## 2016-03-08 ENCOUNTER — Emergency Department
Admission: EM | Admit: 2016-03-08 | Discharge: 2016-03-08 | Disposition: A | Discharge status: Home / Self Care | Payer: Medicaid Other | Attending: Emergency Medicine | Admitting: Emergency Medicine

## 2016-03-08 DIAGNOSIS — F17210 Nicotine dependence, cigarettes, uncomplicated: Secondary | ICD-10-CM | POA: Insufficient documentation

## 2016-03-08 DIAGNOSIS — R079 Chest pain, unspecified: Secondary | ICD-10-CM | POA: Diagnosis present

## 2016-03-08 DIAGNOSIS — R42 Dizziness and giddiness: Secondary | ICD-10-CM | POA: Diagnosis not present

## 2016-03-08 DIAGNOSIS — J069 Acute upper respiratory infection, unspecified: Secondary | ICD-10-CM | POA: Insufficient documentation

## 2016-03-08 LAB — TROPONIN I: TROPONIN I: 0.03 ng/mL — AB (ref <0.03)

## 2016-03-08 LAB — URINALYSIS, COMPLETE (UACMP) WITH MICROSCOPIC
Bilirubin Urine: NEGATIVE
Glucose, UA: NEGATIVE mg/dL
Ketones, ur: 5 mg/dL — AB
Leukocytes, UA: NEGATIVE
Nitrite: NEGATIVE
PH: 5 (ref 5.0–8.0)
Protein, ur: 30 mg/dL — AB
SPECIFIC GRAVITY, URINE: 1.026 (ref 1.005–1.030)

## 2016-03-08 LAB — BASIC METABOLIC PANEL
Anion gap: 9 (ref 5–15)
BUN: 18 mg/dL (ref 6–20)
CALCIUM: 10.2 mg/dL (ref 8.9–10.3)
CO2: 26 mmol/L (ref 22–32)
CREATININE: 1.11 mg/dL — AB (ref 0.44–1.00)
Chloride: 103 mmol/L (ref 101–111)
GFR calc non Af Amer: 59 mL/min — ABNORMAL LOW (ref >60)
GLUCOSE: 89 mg/dL (ref 65–99)
Potassium: 3.8 mmol/L (ref 3.5–5.1)
Sodium: 138 mmol/L (ref 135–145)

## 2016-03-08 LAB — CBC
HCT: 44.6 % (ref 35.0–47.0)
Hemoglobin: 15.5 g/dL (ref 12.0–16.0)
MCH: 34.4 pg — AB (ref 26.0–34.0)
MCHC: 34.8 g/dL (ref 32.0–36.0)
MCV: 98.8 fL (ref 80.0–100.0)
PLATELETS: 270 10*3/uL (ref 150–440)
RBC: 4.51 MIL/uL (ref 3.80–5.20)
RDW: 13.5 % (ref 11.5–14.5)
WBC: 7.6 10*3/uL (ref 3.6–11.0)

## 2016-03-08 LAB — PREGNANCY, URINE: Preg Test, Ur: NEGATIVE

## 2016-03-08 LAB — FIBRIN DERIVATIVES D-DIMER (ARMC ONLY): FIBRIN DERIVATIVES D-DIMER (ARMC): 218 (ref 0–499)

## 2016-03-08 MED ORDER — KETOROLAC TROMETHAMINE 30 MG/ML IJ SOLN
30.0000 mg | Freq: Once | INTRAMUSCULAR | Status: AC
  Administered 2016-03-08: 30 mg via INTRAVENOUS
  Filled 2016-03-08: qty 1

## 2016-03-08 MED ORDER — SODIUM CHLORIDE 0.9 % IV BOLUS (SEPSIS)
1000.0000 mL | Freq: Once | INTRAVENOUS | Status: AC
  Administered 2016-03-08: 1000 mL via INTRAVENOUS

## 2016-03-08 MED ORDER — ASPIRIN 81 MG PO CHEW
324.0000 mg | CHEWABLE_TABLET | Freq: Once | ORAL | Status: AC
  Administered 2016-03-08: 324 mg via ORAL
  Filled 2016-03-08: qty 4

## 2016-03-08 MED ORDER — AZITHROMYCIN 500 MG PO TABS
500.0000 mg | ORAL_TABLET | Freq: Once | ORAL | Status: AC
  Administered 2016-03-08: 500 mg via ORAL
  Filled 2016-03-08: qty 1

## 2016-03-08 MED ORDER — AZITHROMYCIN 250 MG PO TABS: ORAL_TABLET | ORAL | 0 refills | Status: DC

## 2016-03-08 NOTE — ED Notes (Signed)
critical troponin 0.03 MD made aware.

## 2016-03-08 NOTE — ED Triage Notes (Signed)
Patient with complaint of feeling dizzy and intermittent right side chest pain radiating to her back that became worse tonight.

## 2016-03-08 NOTE — ED Notes (Signed)
Repeat troponin sent to lab.  Pt resting comfortably

## 2016-03-08 NOTE — Discharge Instructions (Signed)

## 2016-03-08 NOTE — ED Provider Notes (Signed)
[facility] Emergency Department Provider Note  ____________________________________________  Time seen: Approximately 3:01 AM  I have reviewed the triage vital signs and the nursing notes.   HISTORY  Chief Complaint Dizziness and Chest Pain   HPI Karen George is a 46 y.o. female with no significant past medical history who presents for evaluation of right-sided chest pain. Patient reports that she has had right sided constant chest pain for 10 days worse today. She reports that her pain is dull, located in the right side, radiating to her back, constant, associated with intermittent shortness of breath, currently 8/10. She has had a cough productive of yellow sputum, chills for the same length of time. She also reports that she has had intermittent dizziness. Patient also reports diarrhea for the last few days but no vomiting. Patient endorses family history and her parents of the DVT and PE and also ischemic heart disease. She denies any personal history of ischemic heart disease or blood clots, recent travel or immobilization, leg pain or swelling, hemoptysis, or exogenous hormones. She has never seen a cardiologist. She has never undergone a stress test.  Past Medical History:  Diagnosis Date  . Onychomadesis   . Pilonidal cyst     Patient Active Problem List   Diagnosis Date Noted  . Pilonidal abscess of natal cleft 11/25/2014  . Onychomycosis 11/22/2013    Past Surgical History:  Procedure Laterality Date  . ABDOMINAL HYSTERECTOMY     Total; still has ovaries  . PILONIDAL CYST EXCISION  2010   Done at the [place]  . PILONIDAL CYST EXCISION N/A 12/31/2014   Procedure: CYST EXCISION PILONIDAL EXTENSIVE;  Surgeon: Sherri Rad, MD;  Location: [facility] ORS;  Service: General;  Laterality: N/A;    Prior to Admission medications   Medication Sig Start Date End Date Taking? Authorizing Provider  cetirizine (ZYRTEC) 10 MG tablet Take 10 mg by mouth daily.   Yes  Historical Provider, MD  azithromycin (ZITHROMAX) 250 MG tablet Take 1 a day for 4 days 03/08/16   Rudene Re, MD  clonazePAM (KLONOPIN) 1 MG tablet Take 1 mg by mouth at bedtime.     Historical Provider, MD  meloxicam (MOBIC) 15 MG tablet Take 15 mg by mouth daily.  09/13/14   Historical Provider, MD  mirtazapine (REMERON) 30 MG tablet Take 30 mg by mouth at bedtime.    Historical Provider, MD  ULTRAM 50 MG tablet Take 1 tablet (50 mg total) by mouth every 6 (six) hours as needed for moderate pain. Patient not taking: Reported on 03/08/2016 12/31/14   Sherri Rad, MD    Allergies Patient has no known allergies.  Family History  Problem Relation Age of Onset  . Throat cancer Mother   . Diabetes Father   . Prostate cancer Father   . ADD / ADHD Son     Social History Social History  Substance Use Topics  . Smoking status: Current Some Day Smoker    Packs/day: 0.50    Years: 25.00    Types: Cigarettes  . Smokeless tobacco: Never Used  . Alcohol use No    Review of Systems  Constitutional: Negative for fever. + chills Eyes: Negative for visual changes. ENT: Negative for sore throat. Neck: No neck pain  Cardiovascular: + chest pain. Respiratory: + shortness of breath and productive cough Gastrointestinal: Negative for abdominal pain, vomiting. + diarrhea. Genitourinary: Negative for dysuria. Musculoskeletal: Negative for back pain. Skin: Negative for rash. Neurological: Negative for headaches, weakness  or numbness. Psych: No SI or HI  ____________________________________________   PHYSICAL EXAM:  VITAL SIGNS: ED Triage Vitals  Enc Vitals Group     BP 03/08/16 0115 92/70     Pulse Rate 03/08/16 0115 (!) 101     Resp 03/08/16 0115 18     Temp 03/08/16 0115 98.1 F (36.7 C)     Temp Source 03/08/16 0115 Oral     SpO2 03/08/16 0115 99 %     Weight 03/08/16 0115 175 lb (79.4 kg)     Height 03/08/16 0115 5\' 7"  (1.702 m)     Head Circumference --      Peak Flow --        Pain Score 03/08/16 0116 8     Pain Loc --      Pain Edu? --      Excl. in [place]? --     Constitutional: Alert and oriented. Well appearing and in no apparent distress. HEENT:      Head: Normocephalic and atraumatic.         Eyes: Conjunctivae are normal. Sclera is non-icteric. EOMI. PERRL      Mouth/Throat: Mucous membranes are moist.       Neck: Supple with no signs of meningismus. Cardiovascular: Tachycardic with regular rhythm. No murmurs, gallops, or rubs. 2+ symmetrical distal pulses are present in all extremities. No JVD. Respiratory: Normal respiratory effort. Lungs are clear to auscultation bilaterally. No wheezes, crackles, or rhonchi.  Gastrointestinal: Soft, non tender, and non distended with positive bowel sounds. No rebound or guarding. Genitourinary: No CVA tenderness. Musculoskeletal: Nontender with normal range of motion in all extremities. No edema, cyanosis, or erythema of extremities. Neurologic: Normal speech and language. Face is symmetric. Moving all extremities. No gross focal neurologic deficits are appreciated. Skin: Skin is warm, dry and intact. No rash noted. Psychiatric: Mood and affect are normal. Speech and behavior are normal.  ____________________________________________   LABS (all labs ordered are listed, but only abnormal results are displayed)  Labs Reviewed  BASIC METABOLIC PANEL - Abnormal; Notable for the following:       Result Value   Creatinine, Ser 1.11 (*)    GFR calc non Af Amer 59 (*)    All other components within normal limits  CBC - Abnormal; Notable for the following:    MCH 34.4 (*)    All other components within normal limits  URINALYSIS, COMPLETE (UACMP) WITH MICROSCOPIC - Abnormal; Notable for the following:    Color, Urine YELLOW (*)    APPearance HAZY (*)    Hgb urine dipstick SMALL (*)    Ketones, ur 5 (*)    Protein, ur 30 (*)    Bacteria, UA RARE (*)    Squamous Epithelial / LPF 6-30 (*)    All other components  within normal limits  TROPONIN I - Abnormal; Notable for the following:    Troponin I 0.03 (*)    All other components within normal limits  TROPONIN I  FIBRIN DERIVATIVES D-DIMER ([facility] ONLY)  PREGNANCY, URINE  TROPONIN I   ____________________________________________  EKG  ED ECG REPORT I, Rudene Re, the attending physician, personally viewed and interpreted this ECG.  Sinus tachycardia, rate of 107, normal intervals, normal axis, no ST elevations or depressions.  ____________________________________________  RADIOLOGY  CXR: No active cardiopulmonary disease. ____________________________________________   PROCEDURES  Procedure(s) performed: None Procedures Critical Care performed:  None ____________________________________________   INITIAL IMPRESSION / ASSESSMENT AND PLAN / ED COURSE  45  y.o. female with no significant past medical history who presents for evaluation of 10 days of constant right-sided chest pain associated with productive cough of yellow sputum, diarrhea, and intermittent SOB. Patient is well-appearing, in no distress, she is afebrile, initially in triage she was noted to be tachycardic however during my examination patient's heart rates 88, she is normotensive, lungs are clear to auscultation, no pitting edema, strong symmetric pulses in all 4 extremities. Differential diagnosis including viral URI versus pneumonia versus flu versus PE especially with strong fh. Last likely ACS as the pain has been constant for 10 days and initial EKG shows no evidence of ischemia. We'll get 2 sets of cardiac enzymes.Patient is low risk per Wells criteria and perc positive, will get d-dimer. Will check CXR for evidence of PNA. Will give ASA, IVF.   Clinical Course as of Mar 08 718  Mon Mar 08, 2016  0547 CP resolved with IV toradol. D-dimer negative. CXR with no evidence of PNA however patient was tachycardic with productive cough, so I started her on azithromycin.  Mildly elevated creatinine and ketonuria concerning for mild dehydration, IVF given. First troponin was undetectable and 2nd one was 0.03. Will get 3rd troponin at 7AM and if unchanged will dc home.  [CV]  0720 Pending repeat troponin. Care transferred to Dr. Lady Saucier  [CV]    Clinical Course User Index [CV] Rudene Re, MD    Pertinent labs & imaging results that were available during my care of the patient were reviewed by me and considered in my medical decision making (see chart for details).    ____________________________________________   FINAL CLINICAL IMPRESSION(S) / ED DIAGNOSES  Final diagnoses:  Upper respiratory tract infection, unspecified type      NEW MEDICATIONS STARTED DURING THIS VISIT:  New Prescriptions   AZITHROMYCIN (ZITHROMAX) 250 MG TABLET    Take 1 a day for 4 days     Note:  This document was prepared using Dragon voice recognition software and may include unintentional dictation errors.    Rudene Re, MD 03/08/16 [phone]

## 2018-10-17 ENCOUNTER — Other Ambulatory Visit: Payer: Self-pay | Admitting: Nurse Practitioner

## 2018-10-17 DIAGNOSIS — Z1231 Encounter for screening mammogram for malignant neoplasm of breast: Secondary | ICD-10-CM

## 2018-11-29 ENCOUNTER — Ambulatory Visit
Admission: RE | Admit: 2018-11-29 | Discharge: 2018-11-29 | Disposition: A | Discharge status: Home / Self Care | Payer: Medicaid Other | Source: Ambulatory Visit | Attending: Nurse Practitioner | Admitting: Nurse Practitioner

## 2018-11-29 DIAGNOSIS — Z1231 Encounter for screening mammogram for malignant neoplasm of breast: Secondary | ICD-10-CM | POA: Insufficient documentation

## 2018-11-30 ENCOUNTER — Other Ambulatory Visit: Payer: Self-pay | Admitting: Nurse Practitioner

## 2018-11-30 DIAGNOSIS — R928 Other abnormal and inconclusive findings on diagnostic imaging of breast: Secondary | ICD-10-CM

## 2018-11-30 DIAGNOSIS — R921 Mammographic calcification found on diagnostic imaging of breast: Secondary | ICD-10-CM

## 2018-12-12 ENCOUNTER — Ambulatory Visit: Admission: RE | Admit: 2018-12-12 | Payer: MEDICAID | Source: Ambulatory Visit

## 2018-12-20 ENCOUNTER — Ambulatory Visit
Admission: RE | Admit: 2018-12-20 | Discharge: 2018-12-20 | Disposition: A | Discharge status: Home / Self Care | Payer: Commercial Managed Care - PPO | Source: Ambulatory Visit | Attending: Nurse Practitioner | Admitting: Nurse Practitioner

## 2018-12-20 DIAGNOSIS — R928 Other abnormal and inconclusive findings on diagnostic imaging of breast: Secondary | ICD-10-CM | POA: Diagnosis present

## 2018-12-20 DIAGNOSIS — R921 Mammographic calcification found on diagnostic imaging of breast: Secondary | ICD-10-CM | POA: Diagnosis present

## 2018-12-21 ENCOUNTER — Other Ambulatory Visit: Payer: Self-pay | Admitting: Nurse Practitioner

## 2018-12-21 DIAGNOSIS — R928 Other abnormal and inconclusive findings on diagnostic imaging of breast: Secondary | ICD-10-CM

## 2018-12-21 DIAGNOSIS — R921 Mammographic calcification found on diagnostic imaging of breast: Secondary | ICD-10-CM

## 2019-01-01 ENCOUNTER — Ambulatory Visit
Admission: RE | Admit: 2019-01-01 | Discharge: 2019-01-01 | Disposition: A | Discharge status: Home / Self Care | Payer: Commercial Managed Care - PPO | Source: Ambulatory Visit | Attending: Nurse Practitioner | Admitting: Nurse Practitioner

## 2019-01-01 DIAGNOSIS — R921 Mammographic calcification found on diagnostic imaging of breast: Secondary | ICD-10-CM | POA: Insufficient documentation

## 2019-01-01 DIAGNOSIS — R928 Other abnormal and inconclusive findings on diagnostic imaging of breast: Secondary | ICD-10-CM

## 2019-01-01 DIAGNOSIS — D242 Benign neoplasm of left breast: Secondary | ICD-10-CM | POA: Diagnosis not present

## 2019-01-01 HISTORY — PX: BREAST BIOPSY: SHX20

## 2019-01-02 LAB — SURGICAL PATHOLOGY

## 2019-11-02 ENCOUNTER — Emergency Department
Admission: EM | Admit: 2019-11-02 | Discharge: 2019-11-02 | Disposition: A | Discharge status: Home / Self Care | Payer: Commercial Managed Care - PPO | Attending: Emergency Medicine | Admitting: Emergency Medicine

## 2019-11-02 ENCOUNTER — Other Ambulatory Visit: Payer: Self-pay

## 2019-11-02 ENCOUNTER — Encounter: Payer: Self-pay | Admitting: Emergency Medicine

## 2019-11-02 DIAGNOSIS — Z5321 Procedure and treatment not carried out due to patient leaving prior to being seen by health care provider: Secondary | ICD-10-CM | POA: Insufficient documentation

## 2019-11-02 DIAGNOSIS — M545 Low back pain: Secondary | ICD-10-CM | POA: Insufficient documentation

## 2019-11-02 DIAGNOSIS — R1030 Lower abdominal pain, unspecified: Secondary | ICD-10-CM | POA: Diagnosis not present

## 2019-11-02 LAB — CBC WITH DIFFERENTIAL/PLATELET
Abs Immature Granulocytes: 0.02 10*3/uL (ref 0.00–0.07)
Basophils Absolute: 0 10*3/uL (ref 0.0–0.1)
Basophils Relative: 1 %
Eosinophils Absolute: 0.1 10*3/uL (ref 0.0–0.5)
Eosinophils Relative: 2 %
HCT: 32.2 % — ABNORMAL LOW (ref 36.0–46.0)
Hemoglobin: 11.6 g/dL — ABNORMAL LOW (ref 12.0–15.0)
Immature Granulocytes: 0 %
Lymphocytes Relative: 34 %
Lymphs Abs: 2.2 10*3/uL (ref 0.7–4.0)
MCH: 35 pg — ABNORMAL HIGH (ref 26.0–34.0)
MCHC: 36 g/dL (ref 30.0–36.0)
MCV: 97.3 fL (ref 80.0–100.0)
Monocytes Absolute: 0.5 10*3/uL (ref 0.1–1.0)
Monocytes Relative: 7 %
Neutro Abs: 3.6 10*3/uL (ref 1.7–7.7)
Neutrophils Relative %: 56 %
Platelets: 198 10*3/uL (ref 150–400)
RBC: 3.31 MIL/uL — ABNORMAL LOW (ref 3.87–5.11)
RDW: 13.3 % (ref 11.5–15.5)
WBC: 6.4 10*3/uL (ref 4.0–10.5)
nRBC: 0 % (ref 0.0–0.2)

## 2019-11-02 LAB — COMPREHENSIVE METABOLIC PANEL
ALT: 17 U/L (ref 0–44)
AST: 18 U/L (ref 15–41)
Albumin: 4.4 g/dL (ref 3.5–5.0)
Alkaline Phosphatase: 66 U/L (ref 38–126)
Anion gap: 7 (ref 5–15)
BUN: 20 mg/dL (ref 6–20)
CO2: 26 mmol/L (ref 22–32)
Calcium: 9.2 mg/dL (ref 8.9–10.3)
Chloride: 105 mmol/L (ref 98–111)
Creatinine, Ser: 1.2 mg/dL — ABNORMAL HIGH (ref 0.44–1.00)
GFR calc Af Amer: >60 mL/min (ref >60)
GFR calc non Af Amer: 53 mL/min — ABNORMAL LOW (ref >60)
Glucose, Bld: 96 mg/dL (ref 70–99)
Potassium: 3.4 mmol/L — ABNORMAL LOW (ref 3.5–5.1)
Sodium: 138 mmol/L (ref 135–145)
Total Bilirubin: 0.7 mg/dL (ref 0.3–1.2)
Total Protein: 7.2 g/dL (ref 6.5–8.1)

## 2019-11-02 LAB — URINALYSIS, COMPLETE (UACMP) WITH MICROSCOPIC
Bacteria, UA: NONE SEEN
Bilirubin Urine: NEGATIVE
Glucose, UA: NEGATIVE mg/dL
Ketones, ur: NEGATIVE mg/dL
Leukocytes,Ua: NEGATIVE
Nitrite: NEGATIVE
Protein, ur: NEGATIVE mg/dL
Specific Gravity, Urine: 1.029 (ref 1.005–1.030)
pH: 5 (ref 5.0–8.0)

## 2019-11-02 LAB — LIPASE, BLOOD: Lipase: 31 U/L (ref 11–51)

## 2019-11-02 NOTE — ED Triage Notes (Signed)
Patient ambulatory to triage with steady gait, without difficulty or distress noted; pt reports lower abd/lower back pain since yesterday; denies accomp symptoms

## 2020-10-13 ENCOUNTER — Other Ambulatory Visit: Payer: Self-pay | Admitting: Nurse Practitioner

## 2020-10-13 DIAGNOSIS — R921 Mammographic calcification found on diagnostic imaging of breast: Secondary | ICD-10-CM

## 2020-10-16 ENCOUNTER — Other Ambulatory Visit: Payer: Self-pay | Admitting: Nurse Practitioner

## 2020-10-16 DIAGNOSIS — M545 Low back pain, unspecified: Secondary | ICD-10-CM

## 2020-10-27 ENCOUNTER — Ambulatory Visit: Admission: RE | Admit: 2020-10-27 | Payer: Commercial Managed Care - PPO | Source: Ambulatory Visit

## 2020-11-01 ENCOUNTER — Other Ambulatory Visit: Payer: Self-pay

## 2020-11-01 ENCOUNTER — Ambulatory Visit
Admission: RE | Admit: 2020-11-01 | Discharge: 2020-11-01 | Disposition: A | Discharge status: Home / Self Care | Payer: Commercial Managed Care - PPO | Source: Ambulatory Visit | Attending: Nurse Practitioner | Admitting: Nurse Practitioner

## 2020-11-01 DIAGNOSIS — M545 Low back pain, unspecified: Secondary | ICD-10-CM | POA: Diagnosis present

## 2020-11-05 ENCOUNTER — Ambulatory Visit
Admission: RE | Admit: 2020-11-05 | Discharge: 2020-11-05 | Disposition: A | Discharge status: Home / Self Care | Payer: Commercial Managed Care - PPO | Source: Ambulatory Visit | Attending: Nurse Practitioner | Admitting: Nurse Practitioner

## 2020-11-05 ENCOUNTER — Other Ambulatory Visit: Payer: Self-pay

## 2020-11-05 DIAGNOSIS — R921 Mammographic calcification found on diagnostic imaging of breast: Secondary | ICD-10-CM

## 2021-05-19 IMAGING — MG MM DIGITAL SCREENING BILAT W/ TOMO W/ CAD
8 series · 8 of 24 positions shown · non-contrast
Comparison: Previous exam(s).

CLINICAL DATA: Screening.

EXAM:
DIGITAL SCREENING BILATERAL MAMMOGRAM WITH TOMO AND CAD

[L MLO synth-2D]
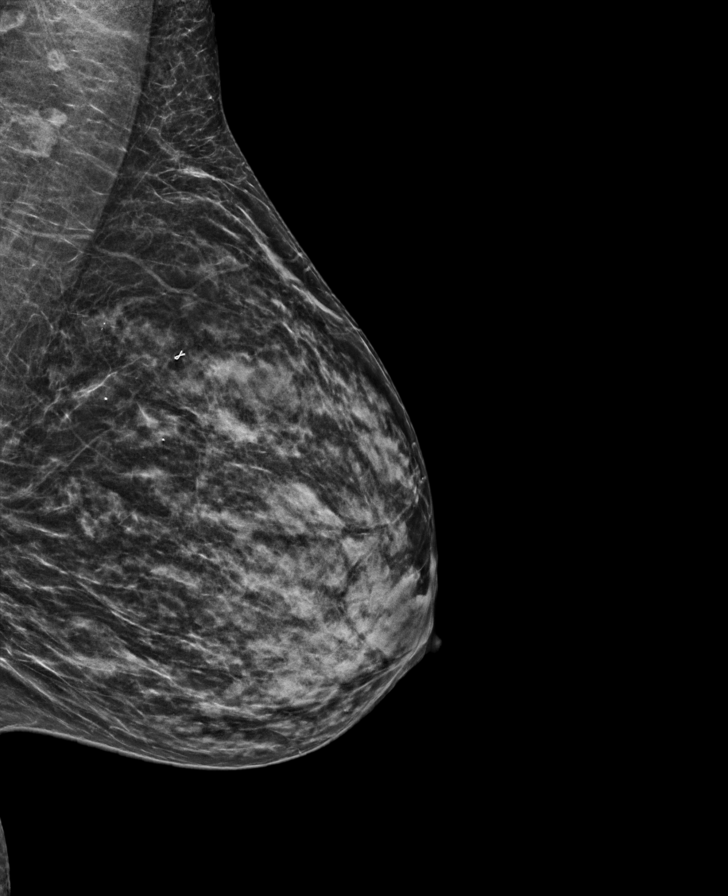

[R CC synth-2D]
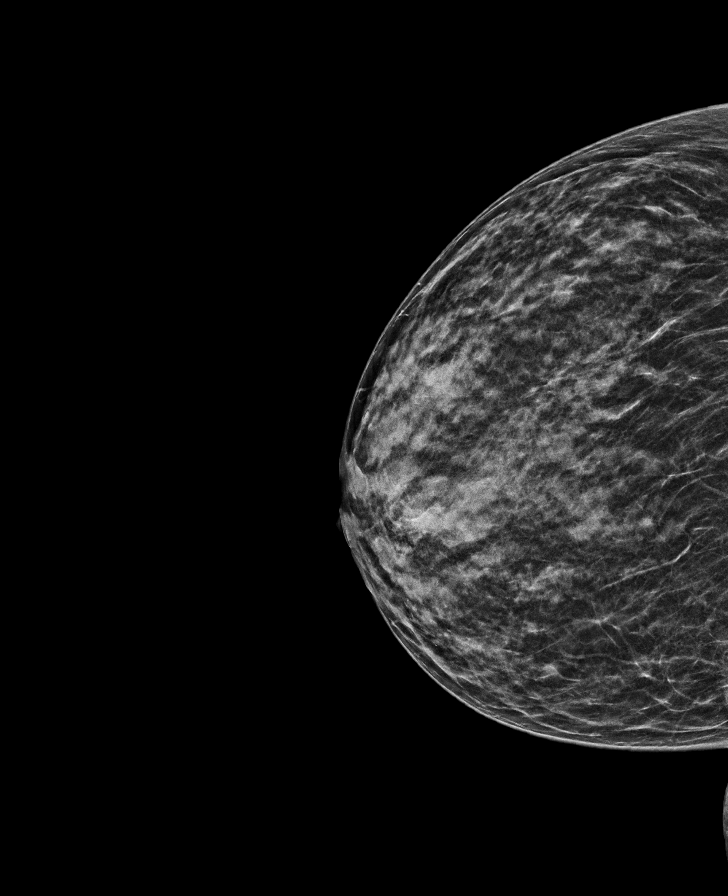

[R MLO synth-2D]
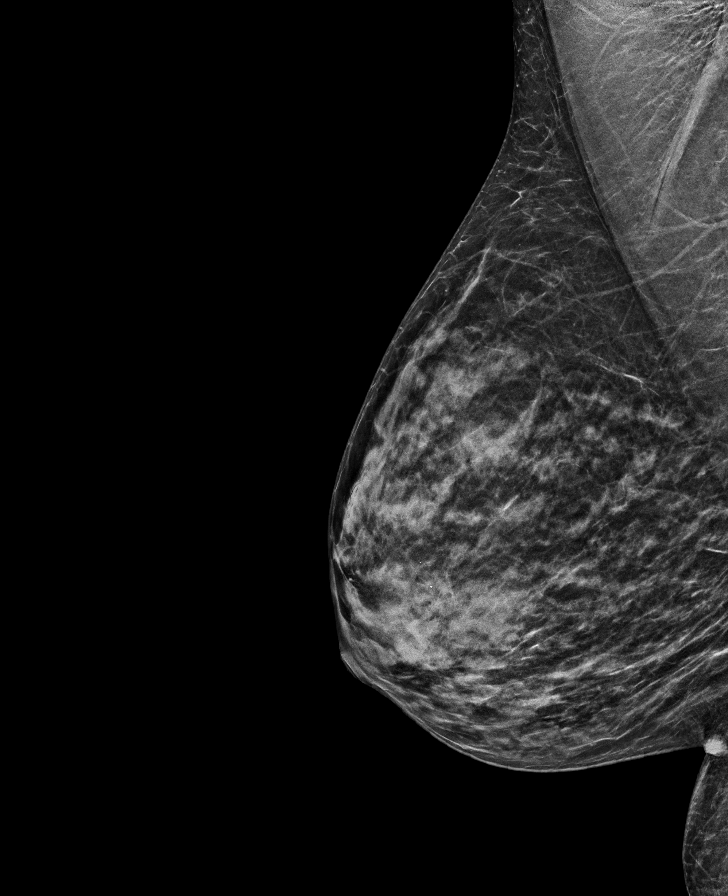

[L CC synth-2D]
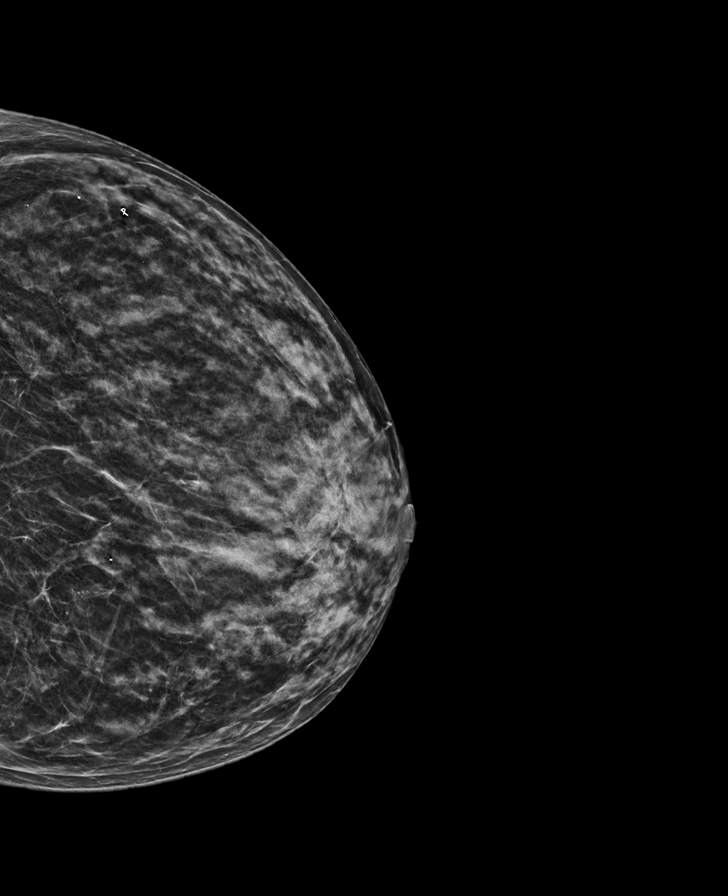

[L CC tomo · tomo slice 24/47.0]
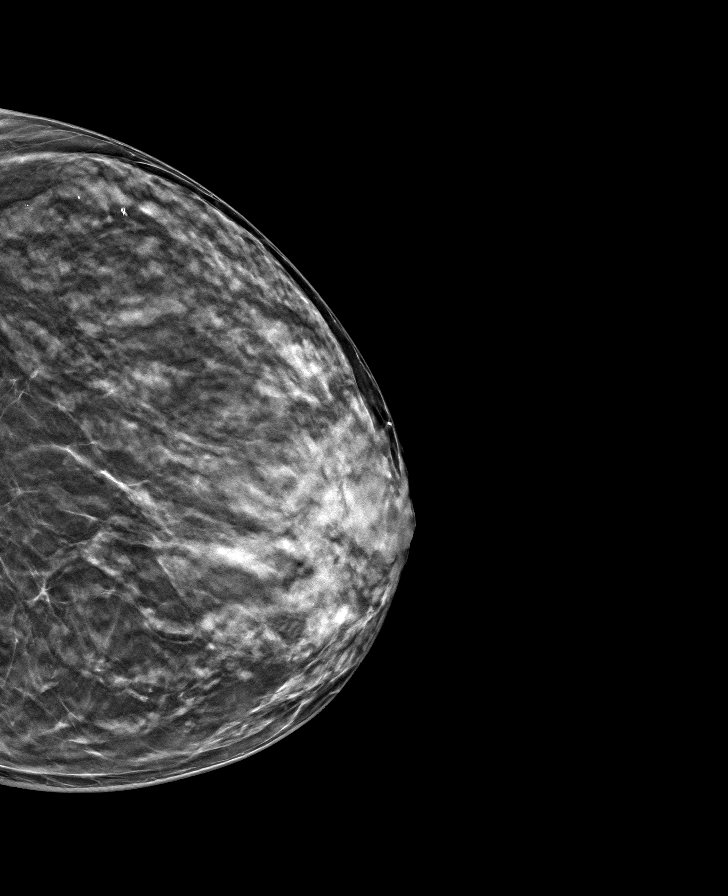

[R CC tomo · tomo slice 21/41.0]
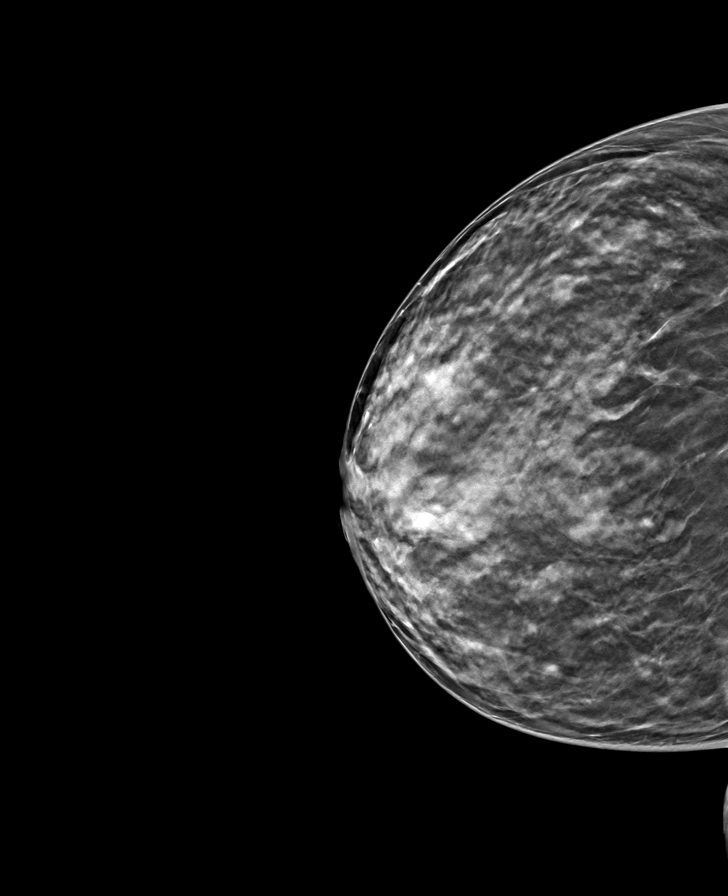

[R MLO tomo · tomo slice 24/47.0]
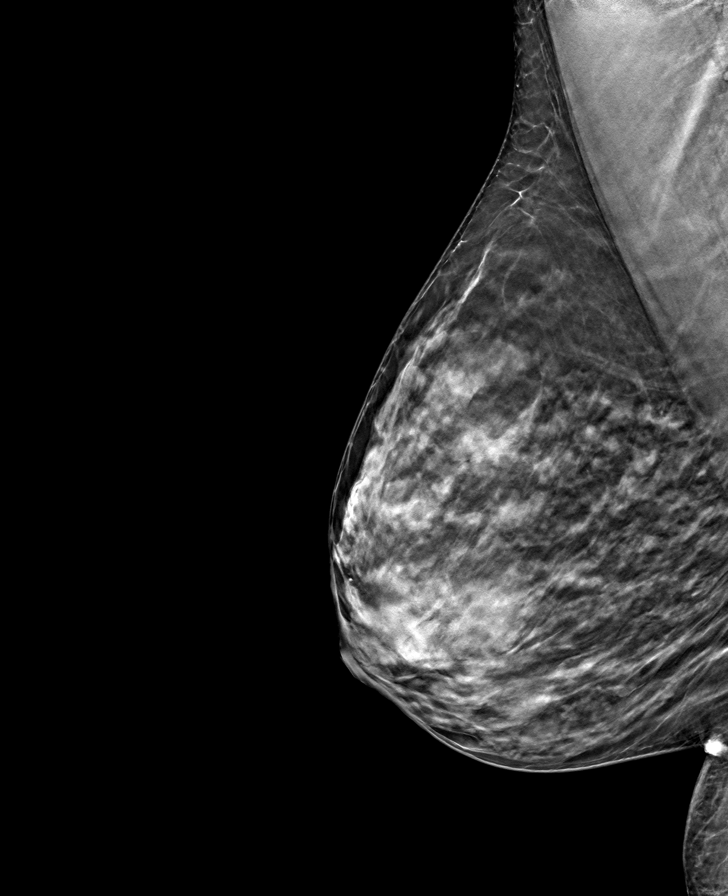

[L MLO tomo · tomo slice 25/48.0]
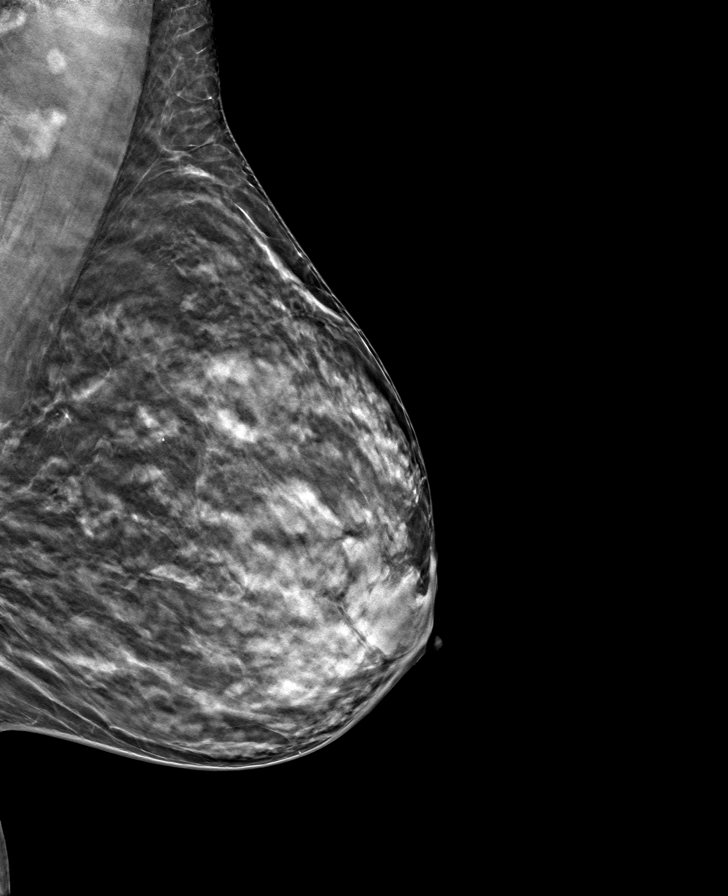

[8 of 24 positions shown; findings below may reference images not displayed]

ACR Breast Density Category d: The breast tissue is extremely dense,
which lowers the sensitivity of mammography.
FINDINGS: In the left breast, calcifications warrant further evaluation. In
the right breast, no findings suspicious for malignancy. Images were
processed with CAD.
IMPRESSION: Further evaluation is suggested for calcifications in the left
breast.

RECOMMENDATION:
Diagnostic mammogram of the left breast. (Code:[id number])

The patient will be contacted regarding the findings, and additional
imaging will be scheduled.

BI-RADS CATEGORY  0: Incomplete. Need additional imaging evaluation
and/or prior mammograms for comparison.

## 2022-10-04 ENCOUNTER — Ambulatory Visit: Payer: Commercial Managed Care - PPO | Admitting: Cardiology

## 2022-10-11 ENCOUNTER — Ambulatory Visit: Payer: Commercial Managed Care - PPO | Admitting: Cardiology

## 2022-10-15 ENCOUNTER — Ambulatory Visit: Payer: Commercial Managed Care - PPO | Admitting: Cardiology

## 2024-03-29 ENCOUNTER — Ambulatory Visit (INDEPENDENT_AMBULATORY_CARE_PROVIDER_SITE_OTHER): Admitting: Cardiology

## 2024-03-29 ENCOUNTER — Encounter: Payer: Self-pay | Admitting: Cardiology

## 2024-03-29 VITALS — BP 122/74 | HR 80 | Ht 67.0 in | Wt 168.0 lb

## 2024-03-29 DIAGNOSIS — E782 Mixed hyperlipidemia: Secondary | ICD-10-CM | POA: Diagnosis not present

## 2024-03-29 DIAGNOSIS — M25562 Pain in left knee: Secondary | ICD-10-CM

## 2024-03-29 DIAGNOSIS — Z13220 Encounter for screening for lipoid disorders: Secondary | ICD-10-CM | POA: Diagnosis not present

## 2024-03-29 DIAGNOSIS — F320 Major depressive disorder, single episode, mild: Secondary | ICD-10-CM

## 2024-03-29 DIAGNOSIS — M25561 Pain in right knee: Secondary | ICD-10-CM | POA: Diagnosis not present

## 2024-03-29 DIAGNOSIS — Z131 Encounter for screening for diabetes mellitus: Secondary | ICD-10-CM

## 2024-03-29 DIAGNOSIS — Z1329 Encounter for screening for other suspected endocrine disorder: Secondary | ICD-10-CM | POA: Diagnosis not present

## 2024-03-29 DIAGNOSIS — E559 Vitamin D deficiency, unspecified: Secondary | ICD-10-CM | POA: Diagnosis not present

## 2024-03-29 DIAGNOSIS — F419 Anxiety disorder, unspecified: Secondary | ICD-10-CM | POA: Diagnosis not present

## 2024-03-29 DIAGNOSIS — Z0130 Encounter for examination of blood pressure without abnormal findings: Secondary | ICD-10-CM

## 2024-03-29 MED ORDER — VENLAFAXINE HCL ER 37.5 MG PO CP24: 37.5000 mg | ORAL_CAPSULE | Freq: Every day | ORAL | 2 refills | Status: AC

## 2024-03-29 MED ORDER — MELOXICAM 15 MG PO TABS: 15.0000 mg | ORAL_TABLET | Freq: Every day | ORAL | 1 refills | Status: AC

## 2024-03-29 NOTE — Progress Notes (Signed)
 "  Established Patient Office Visit  Subjective:  Patient ID: Karen George, female    DOB: 31-Dec-1970  Age: 54 y.o. MRN: [id number]  Chief Complaint  Patient presents with   Follow-up    Bilateral knee pain    Patient in office for over due follow up. Patient has been following at the [facility]. Complaining of bilateral knee pain. Left knee pain and edema started last year, now both knees are hurting. Will refer patient to orthopaedics.  Due for fasting lab work, will do today.  Up to date on mammogram and colonoscopy.  Discussed PQH9 and GAD7, scores elevated. Discussed options. Patient willing to try a daily medication. Will send in Effexor . Will reassess in 4 weeks.  Continue other medications.   Knee Pain  There was no injury mechanism. The pain is present in the right knee and left knee. The pain has been Constant since onset. Pertinent negatives include no numbness or tingling. The symptoms are aggravated by weight bearing. She has tried non-weight bearing, rest and acetaminophen  (Compression, lidocaine  patch) for the symptoms. The treatment provided no relief.    No other concerns at this time.   Past Medical History:  Diagnosis Date   Anxiety    Depression    Hyperlipidemia    Onychomadesis    Pilonidal abscess of natal cleft 11/25/2014   Pilonidal cyst     Past Surgical History:  Procedure Laterality Date   ABDOMINAL HYSTERECTOMY     Total; still has ovaries   BREAST BIOPSY Left 2015   benign   BREAST BIOPSY Left 01/01/2019   , X clip, stereo-BENIGN BREAST TISSUE WITH USUAL DUCTAL HYPERPLASIA AND DENSE STROMAL   PILONIDAL CYST EXCISION  2010   Done at the [facility]   PILONIDAL CYST EXCISION N/A 12/31/2014   Procedure: CYST EXCISION PILONIDAL EXTENSIVE;  Surgeon: Oneil Chang, MD;  Location: [facility];  Service: General;  Laterality: N/A;    Social History   Socioeconomic History   Marital status: Single    Spouse name: Not on file   Number of children: Not on file   Years  of education: Not on file   Highest education level: Not on file  Occupational History   Not on file  Tobacco Use   Smoking status: Some Days    Current packs/day: 0.50    Average packs/day: 0.5 packs/day for 25.0 years (12.5 ttl pk-yrs)    Types: Cigarettes   Smokeless tobacco: Never  Vaping Use   Vaping status: Never Used  Substance and Sexual Activity   Alcohol use: No    Alcohol/week: 0.0 standard drinks of alcohol   Drug use: Yes    Types: Marijuana    Comment:  very rarely   Sexual activity: Not on file  Other Topics Concern   Not on file  Social History Narrative   Not on file   Social Drivers of Health   Tobacco Use: High Risk (03/29/2024)   Patient History    Smoking Tobacco Use: Some Days    Smokeless Tobacco Use: Never    Passive Exposure: Not on file  Financial Resource Strain: Not on file  Food Insecurity: Not on file  Transportation Needs: Not on file  Physical Activity: Not on file  Stress: Not on file  Social Connections: Not on file  Intimate Partner Violence: Not on file  Depression (PHQ2-9): High Risk (03/29/2024)   Depression (PHQ2-9)    PHQ-2 Score: 12  Alcohol Screen: Not on file  Housing: Not  on file  Utilities: Not on file  Health Literacy: Not on file    Family History  Problem Relation Age of Onset   Throat cancer Mother    Diabetes Father    Prostate cancer Father    ADD / ADHD Son     Allergies[1]  Show/hide medication list[2]  Review of Systems  Constitutional: Negative.   HENT: Negative.    Eyes: Negative.   Respiratory: Negative.  Negative for shortness of breath.   Cardiovascular: Negative.  Negative for chest pain.  Gastrointestinal: Negative.  Negative for abdominal pain, constipation and diarrhea.  Genitourinary: Negative.   Musculoskeletal:  Negative for joint pain and myalgias.  Skin: Negative.   Neurological: Negative.  Negative for dizziness, tingling, numbness and headaches.  Endo/Heme/Allergies: Negative.    All other systems reviewed and are negative.      Objective:   BP 122/74   Pulse 80   Ht 5' 7 (1.702 m)   Wt 168 lb (76.2 kg)   SpO2 97%   BMI 26.31 kg/m   Vitals:   03/29/24 0934  BP: 122/74  Pulse: 80  Height: 5' 7 (1.702 m)  Weight: 168 lb (76.2 kg)  SpO2: 97%  BMI (Calculated): 26.31    Physical Exam Vitals and nursing note reviewed.  Constitutional:      Appearance: Normal appearance. She is normal weight.  HENT:     Head: Normocephalic and atraumatic.     Nose: Nose normal.     Mouth/Throat:     Mouth: Mucous membranes are moist.  Eyes:     Extraocular Movements: Extraocular movements intact.     Conjunctiva/sclera: Conjunctivae normal.     Pupils: Pupils are equal, round, and reactive to light.  Cardiovascular:     Rate and Rhythm: Normal rate and regular rhythm.     Pulses: Normal pulses.     Heart sounds: Normal heart sounds.  Pulmonary:     Effort: Pulmonary effort is normal.     Breath sounds: Normal breath sounds.  Abdominal:     General: Abdomen is flat. Bowel sounds are normal.     Palpations: Abdomen is soft.  Musculoskeletal:        General: Normal range of motion.     Cervical back: Normal range of motion.  Skin:    General: Skin is warm and dry.  Neurological:     General: No focal deficit present.     Mental Status: She is alert and oriented to person, place, and time.  Psychiatric:        Mood and Affect: Mood normal.        Behavior: Behavior normal.        Thought Content: Thought content normal.        Judgment: Judgment normal.       03/29/2024    9:45 AM  GAD 7 : Generalized Anxiety Score  Nervous, Anxious, on Edge 2  Control/stop worrying 2  Worry too much - different things 2  Trouble relaxing 3  Restless 1  Easily annoyed or irritable 1  Afraid - awful might happen 0  Total GAD 7 Score 11    Flowsheet Row Office Visit from 03/29/2024 in [facility]  Thoughts that you would be better off dead,  or of hurting yourself in some way Not at all  PHQ-9 Total Score 12      No results found for any visits on 03/29/24.  No results found for this or any  previous visit (from the past 2160 hours).    Assessment & Plan:  Referral sent to orthopaedic surgery Fasting lab work today Effexor  37.5 mg daily  Problem List Items Addressed This Visit       Other   Current mild episode of major depressive disorder   Relevant Medications   venlafaxine  XR (EFFEXOR  XR) 37.5 MG 24 hr capsule   Anxiety   Relevant Medications   venlafaxine  XR (EFFEXOR  XR) 37.5 MG 24 hr capsule   Mixed hyperlipidemia   Relevant Medications   rosuvastatin (CRESTOR) 40 MG tablet   Vitamin D deficiency   Relevant Orders   Vitamin D (25 hydroxy)   Acute pain of both knees - Primary   Relevant Orders   Ambulatory referral to Orthopedic Surgery   Other Visit Diagnoses       Diabetes mellitus screening       Relevant Orders   Hemoglobin A1c   CMP14+EGFR     Thyroid disorder screening       Relevant Orders   TSH     Lipid screening       Relevant Orders   Lipid Profile   CMP14+EGFR       Return in about 4 weeks (around 04/26/2024).   Total time spent: 25 minutes. This time includes review of previous notes and results and patient face to face interaction during today's visit.    Jeoffrey Pollen, NP  03/29/2024   This document may have been prepared by [facility] Voice Recognition software and as such may include unintentional dictation errors.     [1] No Known Allergies [2]  Outpatient Medications Prior to Visit  Medication Sig   Cholecalciferol (VITAMIN D3) 50 MCG (2000 UT) TABS Take by mouth.   cyclobenzaprine (FLEXERIL) 10 MG tablet Take 10 mg by mouth 3 (three) times daily as needed for muscle spasms.   lidocaine  (LIDODERM ) 5 % Place 1 patch onto the skin daily. Remove & Discard patch within 12 hours or as directed by MD   oxybutynin (DITROPAN) 5 MG tablet Take 5 mg by mouth daily.    rosuvastatin (CRESTOR) 40 MG tablet Take 40 mg by mouth daily.   [DISCONTINUED] azithromycin  (ZITHROMAX ) 250 MG tablet Take 1 a day for 4 days   [DISCONTINUED] cetirizine (ZYRTEC) 10 MG tablet Take 10 mg by mouth daily.   [DISCONTINUED] clonazePAM (KLONOPIN) 1 MG tablet Take 1 mg by mouth at bedtime.    [DISCONTINUED] meloxicam  (MOBIC ) 15 MG tablet Take 15 mg by mouth daily.    [DISCONTINUED] mirtazapine (REMERON) 30 MG tablet Take 30 mg by mouth at bedtime.   [DISCONTINUED] ULTRAM  50 MG tablet Take 1 tablet (50 mg total) by mouth every 6 (six) hours as needed for moderate pain. (Patient not taking: Reported on 03/08/2016)   No facility-administered medications prior to visit.   "

## 2024-03-30 ENCOUNTER — Ambulatory Visit: Payer: Self-pay | Admitting: Cardiology

## 2024-03-30 LAB — LIPID PANEL
Chol/HDL Ratio: 3.7 ratio (ref 0.0–4.4)
Cholesterol, Total: 173 mg/dL (ref 100–199)
HDL: 47 mg/dL
LDL Chol Calc (NIH): 112 mg/dL — ABNORMAL HIGH (ref 0–99)
Triglycerides: 76 mg/dL (ref 0–149)
VLDL Cholesterol Cal: 14 mg/dL (ref 5–40)

## 2024-03-30 LAB — TSH: TSH: 1.37 u[IU]/mL (ref 0.450–4.500)

## 2024-03-30 LAB — CMP14+EGFR
ALT: 10 [IU]/L (ref 0–32)
AST: 15 [IU]/L (ref 0–40)
Albumin: 4.5 g/dL (ref 3.8–4.9)
Alkaline Phosphatase: 115 [IU]/L (ref 49–135)
BUN/Creatinine Ratio: 11 (ref 9–23)
BUN: 10 mg/dL (ref 6–24)
Bilirubin Total: 0.3 mg/dL (ref 0.0–1.2)
CO2: 21 mmol/L (ref 20–29)
Calcium: 10.1 mg/dL (ref 8.7–10.2)
Chloride: 102 mmol/L (ref 96–106)
Creatinine, Ser: 0.95 mg/dL (ref 0.57–1.00)
Globulin, Total: 2.6 g/dL (ref 1.5–4.5)
Glucose: 96 mg/dL (ref 70–99)
Potassium: 4.3 mmol/L (ref 3.5–5.2)
Sodium: 140 mmol/L (ref 134–144)
Total Protein: 7.1 g/dL (ref 6.0–8.5)
eGFR: 71 mL/min/{1.73_m2}

## 2024-03-30 LAB — HEMOGLOBIN A1C
Est. average glucose Bld gHb Est-mCnc: 108 mg/dL
Hgb A1c MFr Bld: 5.4 % (ref 4.8–5.6)

## 2024-03-30 LAB — VITAMIN D 25 HYDROXY (VIT D DEFICIENCY, FRACTURES): Vit D, 25-Hydroxy: 36.6 ng/mL (ref 30.0–100.0)

## 2024-04-26 ENCOUNTER — Ambulatory Visit: Admitting: Cardiology
